# Patient Record
Sex: Female | Born: 1980
Health system: Southern US, Community
[De-identification: ages and names within clinical notes are randomized; demographics above are authoritative.]

## PROBLEM LIST (undated history)

## (undated) DIAGNOSIS — R102 Pelvic and perineal pain: Secondary | ICD-10-CM

## (undated) DIAGNOSIS — R87629 Unspecified abnormal cytological findings in specimens from vagina: Secondary | ICD-10-CM

## (undated) DIAGNOSIS — N946 Dysmenorrhea, unspecified: Secondary | ICD-10-CM

## (undated) DIAGNOSIS — Z8619 Personal history of other infectious and parasitic diseases: Secondary | ICD-10-CM

## (undated) DIAGNOSIS — N921 Excessive and frequent menstruation with irregular cycle: Secondary | ICD-10-CM

## (undated) DIAGNOSIS — IMO0002 Reserved for concepts with insufficient information to code with codable children: Secondary | ICD-10-CM

## (undated) DIAGNOSIS — R87619 Unspecified abnormal cytological findings in specimens from cervix uteri: Secondary | ICD-10-CM

## (undated) DIAGNOSIS — Z87440 Personal history of urinary (tract) infections: Secondary | ICD-10-CM

## (undated) DIAGNOSIS — G43909 Migraine, unspecified, not intractable, without status migrainosus: Secondary | ICD-10-CM

## (undated) DIAGNOSIS — M797 Fibromyalgia: Secondary | ICD-10-CM

## (undated) DIAGNOSIS — I10 Essential (primary) hypertension: Secondary | ICD-10-CM

## (undated) DIAGNOSIS — F17200 Nicotine dependence, unspecified, uncomplicated: Secondary | ICD-10-CM

## (undated) DIAGNOSIS — R350 Frequency of micturition: Secondary | ICD-10-CM

## (undated) DIAGNOSIS — M069 Rheumatoid arthritis, unspecified: Secondary | ICD-10-CM

## (undated) HISTORY — DX: Pelvic and perineal pain: R10.2

## (undated) HISTORY — DX: Excessive and frequent menstruation with irregular cycle: N92.1

## (undated) HISTORY — DX: Dysmenorrhea, unspecified: N94.6

## (undated) HISTORY — PX: TUBAL LIGATION: SHX77

## (undated) HISTORY — DX: Migraine, unspecified, not intractable, without status migrainosus: G43.909

## (undated) HISTORY — DX: Unspecified abnormal cytological findings in specimens from vagina: R87.629

## (undated) HISTORY — DX: Personal history of urinary (tract) infections: Z87.440

## (undated) HISTORY — DX: Personal history of other infectious and parasitic diseases: Z86.19

## (undated) HISTORY — DX: Fibromyalgia: M79.7

## (undated) HISTORY — DX: Reserved for concepts with insufficient information to code with codable children: IMO0002

## (undated) HISTORY — DX: Rheumatoid arthritis, unspecified: M06.9

## (undated) HISTORY — DX: Essential (primary) hypertension: I10

## (undated) HISTORY — DX: Frequency of micturition: R35.0

## (undated) HISTORY — DX: Unspecified abnormal cytological findings in specimens from cervix uteri: R87.619

## (undated) HISTORY — DX: Nicotine dependence, unspecified, uncomplicated: F17.200

---

## 2000-05-26 ENCOUNTER — Emergency Department (HOSPITAL_COMMUNITY): Admission: EM | Admit: 2000-05-26 | Discharge: 2000-05-26 | Payer: Self-pay | Admitting: *Deleted

## 2000-08-17 ENCOUNTER — Emergency Department (HOSPITAL_COMMUNITY): Admission: EM | Admit: 2000-08-17 | Discharge: 2000-08-17 | Payer: Self-pay | Admitting: Emergency Medicine

## 2000-08-17 ENCOUNTER — Encounter: Payer: Self-pay | Admitting: *Deleted

## 2000-09-09 ENCOUNTER — Other Ambulatory Visit: Admission: RE | Admit: 2000-09-09 | Discharge: 2000-09-09 | Payer: Self-pay | Admitting: Obstetrics and Gynecology

## 2001-10-28 ENCOUNTER — Other Ambulatory Visit: Admission: RE | Admit: 2001-10-28 | Discharge: 2001-10-28 | Payer: Self-pay | Admitting: Obstetrics and Gynecology

## 2001-11-05 ENCOUNTER — Emergency Department (HOSPITAL_COMMUNITY): Admission: EM | Admit: 2001-11-05 | Discharge: 2001-11-05 | Payer: Self-pay | Admitting: *Deleted

## 2002-06-17 ENCOUNTER — Emergency Department (HOSPITAL_COMMUNITY): Admission: EM | Admit: 2002-06-17 | Discharge: 2002-06-17 | Payer: Self-pay | Admitting: Emergency Medicine

## 2002-09-25 ENCOUNTER — Encounter: Payer: Self-pay | Admitting: Internal Medicine

## 2002-09-25 ENCOUNTER — Ambulatory Visit (HOSPITAL_COMMUNITY): Admission: RE | Admit: 2002-09-25 | Discharge: 2002-09-25 | Payer: Self-pay | Admitting: Internal Medicine

## 2002-11-27 ENCOUNTER — Emergency Department (HOSPITAL_COMMUNITY): Admission: EM | Admit: 2002-11-27 | Discharge: 2002-11-27 | Payer: Self-pay | Admitting: Emergency Medicine

## 2002-11-29 ENCOUNTER — Emergency Department (HOSPITAL_COMMUNITY): Admission: EM | Admit: 2002-11-29 | Discharge: 2002-11-29 | Payer: Self-pay | Admitting: Emergency Medicine

## 2002-12-10 ENCOUNTER — Emergency Department (HOSPITAL_COMMUNITY): Admission: EM | Admit: 2002-12-10 | Discharge: 2002-12-10 | Payer: Self-pay | Admitting: Emergency Medicine

## 2003-01-31 ENCOUNTER — Emergency Department (HOSPITAL_COMMUNITY): Admission: EM | Admit: 2003-01-31 | Discharge: 2003-01-31 | Payer: Self-pay | Admitting: Emergency Medicine

## 2003-07-20 ENCOUNTER — Emergency Department (HOSPITAL_COMMUNITY): Admission: EM | Admit: 2003-07-20 | Discharge: 2003-07-20 | Payer: Self-pay | Admitting: Emergency Medicine

## 2004-01-23 HISTORY — PX: TUBAL LIGATION: SHX77

## 2004-02-08 ENCOUNTER — Observation Stay (HOSPITAL_COMMUNITY): Admission: AD | Admit: 2004-02-08 | Discharge: 2004-02-09 | Payer: Self-pay | Admitting: Obstetrics and Gynecology

## 2004-02-09 ENCOUNTER — Ambulatory Visit (HOSPITAL_COMMUNITY): Admission: AD | Admit: 2004-02-09 | Discharge: 2004-02-09 | Payer: Self-pay | Admitting: Obstetrics and Gynecology

## 2004-02-22 ENCOUNTER — Ambulatory Visit (HOSPITAL_COMMUNITY): Admission: AD | Admit: 2004-02-22 | Discharge: 2004-02-22 | Payer: Self-pay | Admitting: Obstetrics and Gynecology

## 2004-03-03 ENCOUNTER — Ambulatory Visit (HOSPITAL_COMMUNITY): Admission: AD | Admit: 2004-03-03 | Discharge: 2004-03-03 | Payer: Self-pay | Admitting: Obstetrics and Gynecology

## 2004-03-06 ENCOUNTER — Ambulatory Visit (HOSPITAL_COMMUNITY): Admission: AD | Admit: 2004-03-06 | Discharge: 2004-03-06 | Payer: Self-pay | Admitting: Obstetrics and Gynecology

## 2004-03-20 ENCOUNTER — Inpatient Hospital Stay (HOSPITAL_COMMUNITY): Admission: RE | Admit: 2004-03-20 | Discharge: 2004-03-22 | Payer: Self-pay | Admitting: Obstetrics and Gynecology

## 2004-08-28 ENCOUNTER — Emergency Department (HOSPITAL_COMMUNITY): Admission: EM | Admit: 2004-08-28 | Discharge: 2004-08-28 | Payer: Self-pay | Admitting: Emergency Medicine

## 2004-08-31 ENCOUNTER — Emergency Department (HOSPITAL_COMMUNITY): Admission: EM | Admit: 2004-08-31 | Discharge: 2004-08-31 | Payer: Self-pay | Admitting: Emergency Medicine

## 2005-03-13 ENCOUNTER — Emergency Department (HOSPITAL_COMMUNITY): Admission: EM | Admit: 2005-03-13 | Discharge: 2005-03-13 | Payer: Self-pay | Admitting: Emergency Medicine

## 2005-03-19 ENCOUNTER — Emergency Department (HOSPITAL_COMMUNITY): Admission: EM | Admit: 2005-03-19 | Discharge: 2005-03-19 | Payer: Self-pay | Admitting: Emergency Medicine

## 2006-01-31 ENCOUNTER — Ambulatory Visit: Payer: Self-pay | Admitting: Orthopedic Surgery

## 2006-02-11 ENCOUNTER — Ambulatory Visit: Payer: Self-pay | Admitting: Orthopedic Surgery

## 2006-02-13 ENCOUNTER — Encounter (HOSPITAL_COMMUNITY): Admission: RE | Admit: 2006-02-13 | Discharge: 2006-03-15 | Payer: Self-pay | Admitting: Orthopedic Surgery

## 2006-02-21 ENCOUNTER — Ambulatory Visit: Payer: Self-pay | Admitting: Orthopedic Surgery

## 2006-03-14 ENCOUNTER — Ambulatory Visit: Payer: Self-pay | Admitting: Orthopedic Surgery

## 2006-03-19 ENCOUNTER — Ambulatory Visit (HOSPITAL_COMMUNITY): Admission: RE | Admit: 2006-03-19 | Discharge: 2006-03-19 | Payer: Self-pay | Admitting: Obstetrics & Gynecology

## 2006-03-20 ENCOUNTER — Encounter (HOSPITAL_COMMUNITY): Admission: RE | Admit: 2006-03-20 | Discharge: 2006-04-19 | Payer: Self-pay | Admitting: Orthopedic Surgery

## 2006-04-11 ENCOUNTER — Ambulatory Visit: Payer: Self-pay | Admitting: Orthopedic Surgery

## 2006-06-27 ENCOUNTER — Ambulatory Visit: Payer: Self-pay | Admitting: Orthopedic Surgery

## 2006-08-01 ENCOUNTER — Ambulatory Visit: Payer: Self-pay | Admitting: Orthopedic Surgery

## 2006-08-27 ENCOUNTER — Ambulatory Visit (HOSPITAL_COMMUNITY): Admission: RE | Admit: 2006-08-27 | Discharge: 2006-08-27 | Payer: Self-pay | Admitting: Orthopedic Surgery

## 2006-09-09 ENCOUNTER — Ambulatory Visit: Payer: Self-pay | Admitting: Orthopedic Surgery

## 2006-10-11 ENCOUNTER — Emergency Department (HOSPITAL_COMMUNITY): Admission: EM | Admit: 2006-10-11 | Discharge: 2006-10-11 | Payer: Self-pay | Admitting: Emergency Medicine

## 2006-10-21 ENCOUNTER — Ambulatory Visit: Payer: Self-pay | Admitting: Orthopedic Surgery

## 2006-10-21 DIAGNOSIS — M7580 Other shoulder lesions, unspecified shoulder: Secondary | ICD-10-CM

## 2006-10-21 DIAGNOSIS — M549 Dorsalgia, unspecified: Secondary | ICD-10-CM | POA: Insufficient documentation

## 2006-10-21 DIAGNOSIS — M25519 Pain in unspecified shoulder: Secondary | ICD-10-CM

## 2006-10-25 ENCOUNTER — Ambulatory Visit (HOSPITAL_COMMUNITY): Admission: RE | Admit: 2006-10-25 | Discharge: 2006-10-25 | Payer: Self-pay | Admitting: Orthopedic Surgery

## 2006-10-25 ENCOUNTER — Encounter: Payer: Self-pay | Admitting: Orthopedic Surgery

## 2006-10-31 ENCOUNTER — Ambulatory Visit: Payer: Self-pay | Admitting: Orthopedic Surgery

## 2006-11-12 ENCOUNTER — Other Ambulatory Visit: Admission: RE | Admit: 2006-11-12 | Discharge: 2006-11-12 | Payer: Self-pay | Admitting: Obstetrics and Gynecology

## 2006-12-02 ENCOUNTER — Emergency Department (HOSPITAL_COMMUNITY): Admission: EM | Admit: 2006-12-02 | Discharge: 2006-12-02 | Payer: Self-pay | Admitting: Emergency Medicine

## 2006-12-05 ENCOUNTER — Ambulatory Visit: Payer: Self-pay | Admitting: Orthopedic Surgery

## 2006-12-05 DIAGNOSIS — IMO0002 Reserved for concepts with insufficient information to code with codable children: Secondary | ICD-10-CM | POA: Insufficient documentation

## 2007-01-03 ENCOUNTER — Encounter: Payer: Self-pay | Admitting: Orthopedic Surgery

## 2007-05-23 ENCOUNTER — Other Ambulatory Visit: Admission: RE | Admit: 2007-05-23 | Discharge: 2007-05-23 | Payer: Self-pay | Admitting: Obstetrics and Gynecology

## 2007-06-06 ENCOUNTER — Ambulatory Visit: Payer: Self-pay | Admitting: Cardiology

## 2007-06-10 ENCOUNTER — Ambulatory Visit (HOSPITAL_COMMUNITY): Admission: RE | Admit: 2007-06-10 | Discharge: 2007-06-10 | Payer: Self-pay | Admitting: Obstetrics & Gynecology

## 2007-08-28 ENCOUNTER — Encounter: Payer: Self-pay | Admitting: Orthopedic Surgery

## 2008-09-30 IMAGING — US US SOFT TISSUE HEAD/NECK
1 series · 14 of 24 positions shown · non-contrast
Comparison: None

CLINICAL DATA: Enlarged thyroid gland

THYROID ULTRASOUND
TECHNIQUE: Ultrasound examination of the thyroid gland and adjacent
soft tissues was performed.

[Series 1: unknown · 0.09mm/px · 14 of 24 slices shown]
[im 1/24]
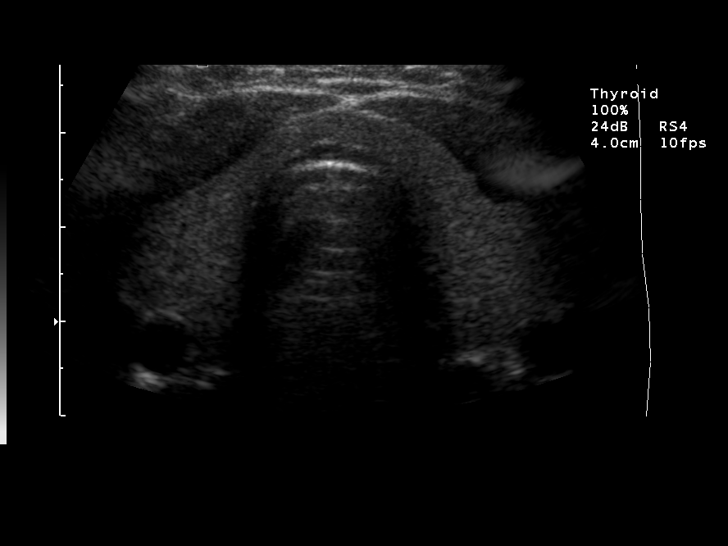
[im 3/24]
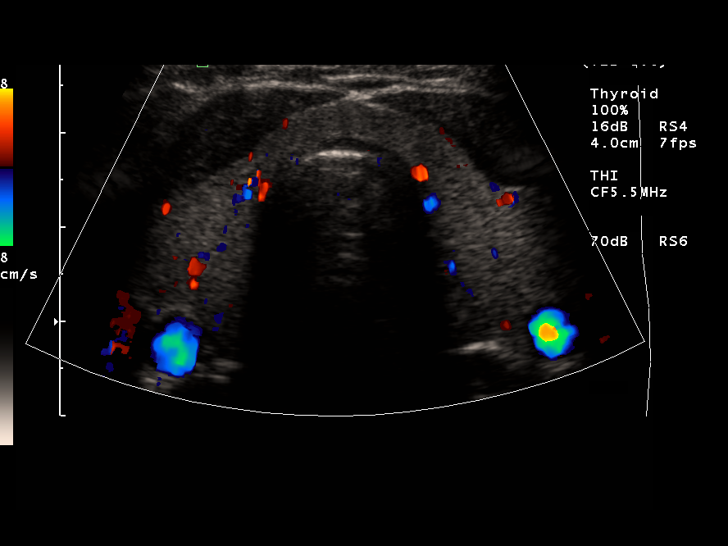
[im 5/24]
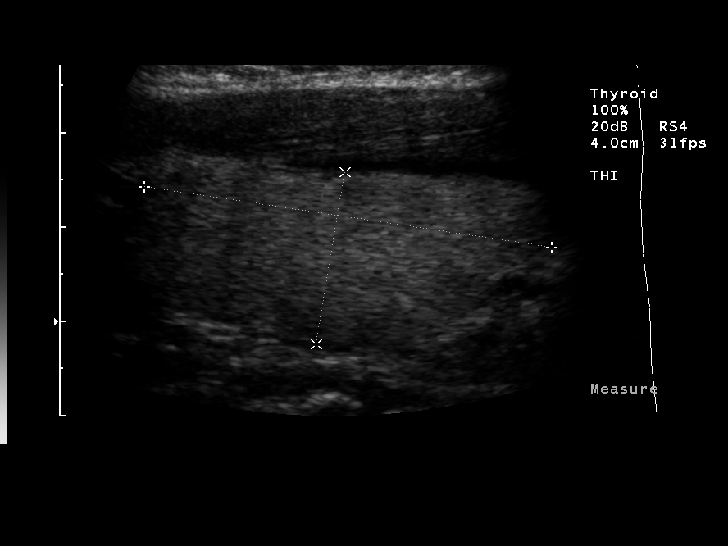
[im 7/24]
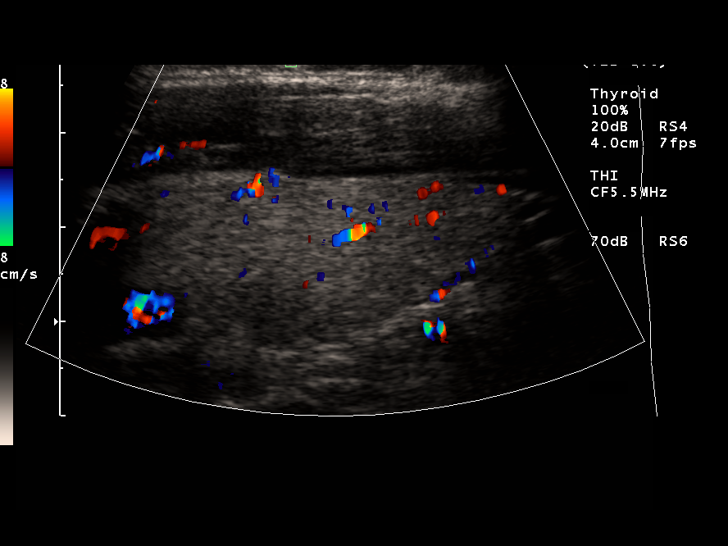
[im 8/24]
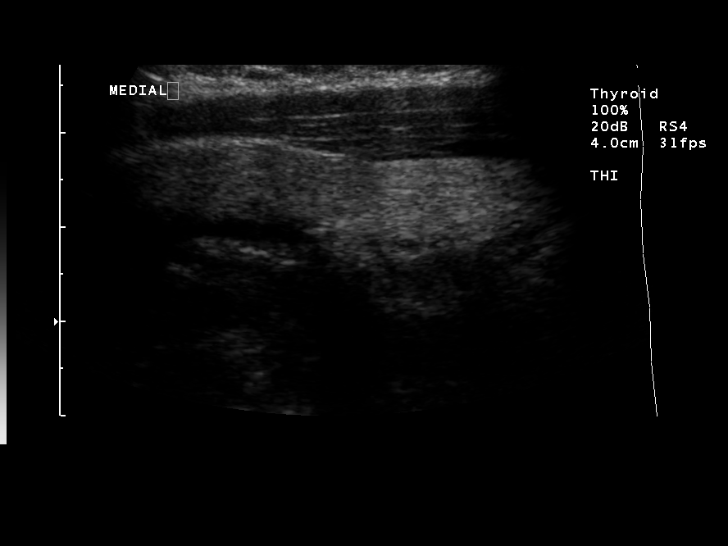
[im 10/24]
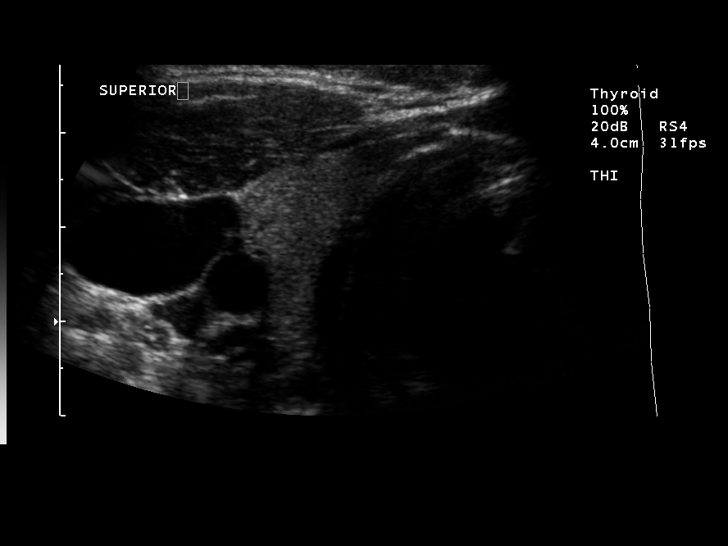
[im 12/24]
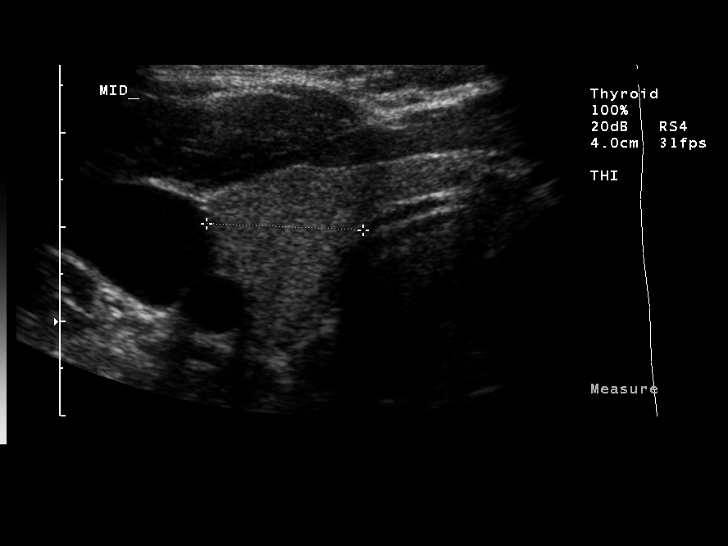
[im 13/24]
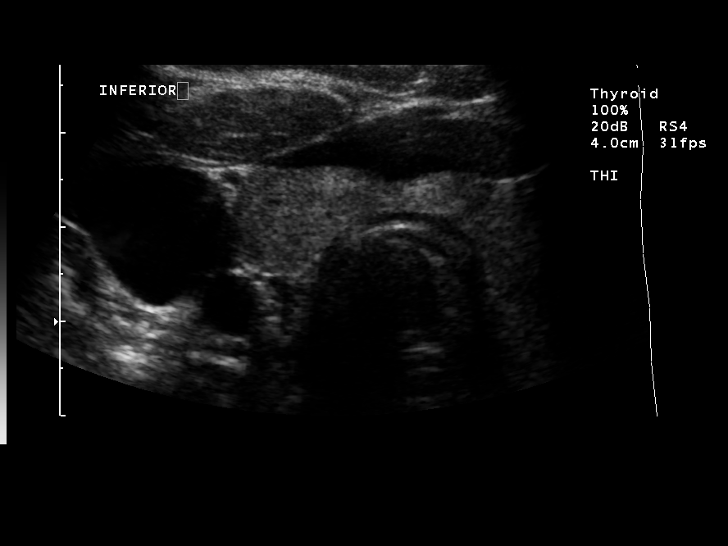
[im 15/24]
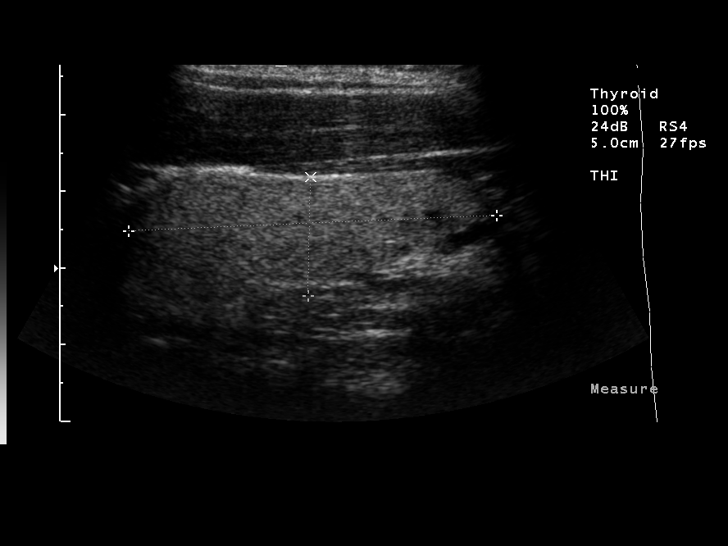
[im 17/24]
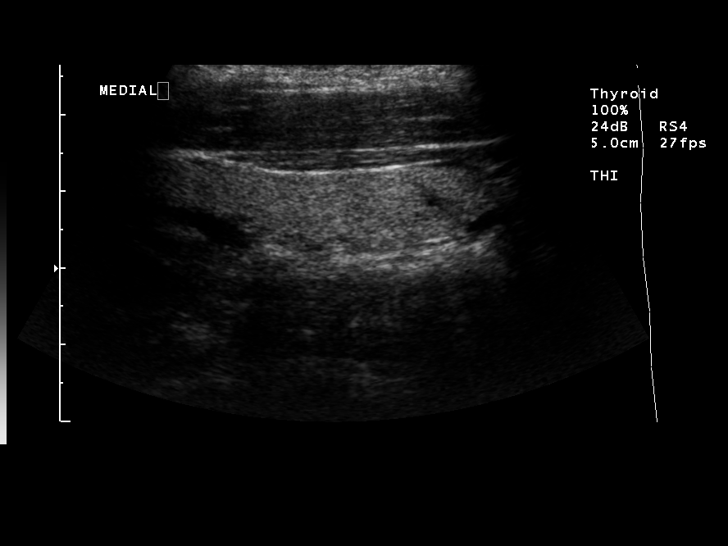
[im 19/24]
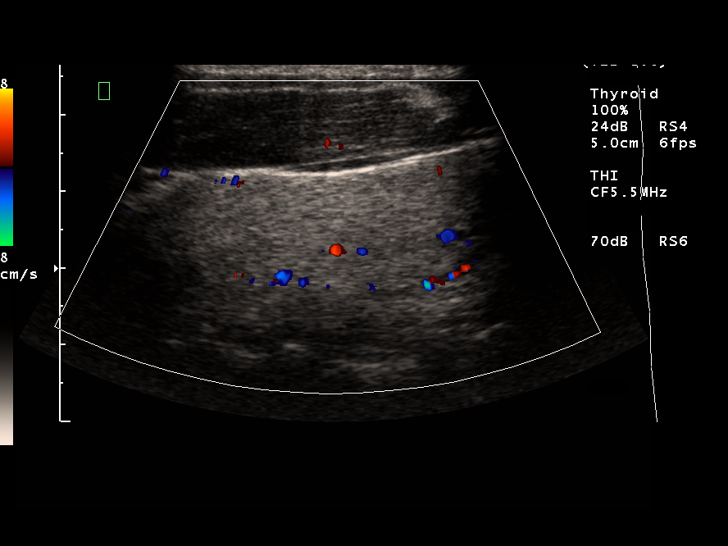
[im 20/24]
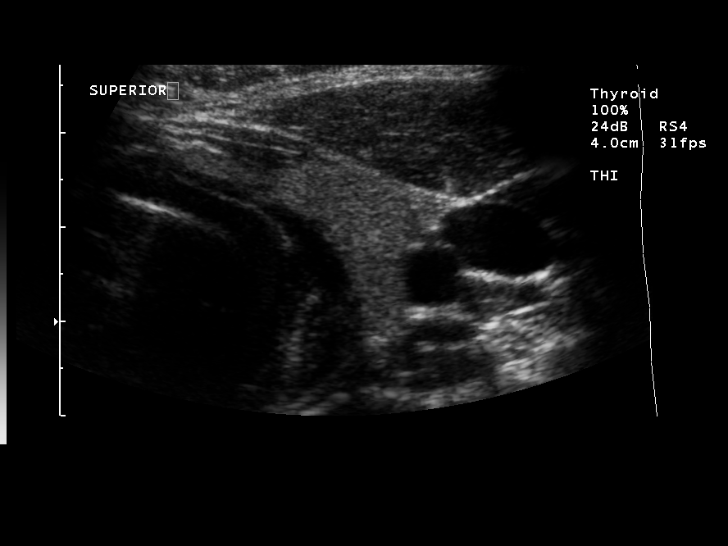
[im 22/24]
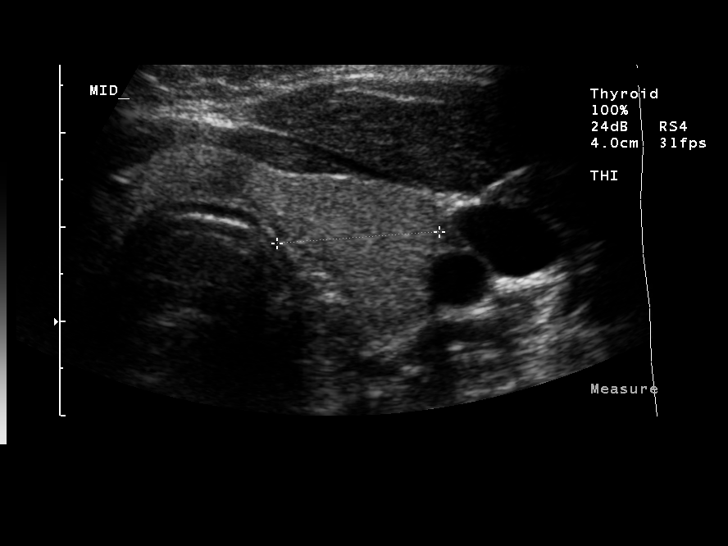
[im 24/24]
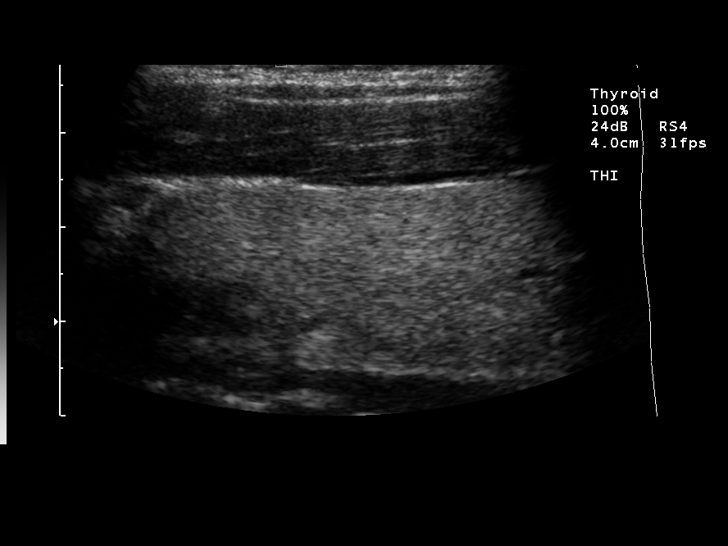

[14 of 24 positions shown; findings below may reference images not displayed]

FINDINGS: Right thyroid lobe 4.4 cm length by 1.8 cm AP by 1.7 cm transverse.
Left thyroid lobe 4.8 cm length by 1.6 cm AP by 1.7 cm transverse.
Thyroid isthmus 4 mm thick.
Normal appearing homogeneous thyroid echogenicity throughout both
lobes.
No thyroid mass, calcification, or cyst.
No regional adenopathy.
Symmetric internal thyroidal blood flow on color Doppler imaging.
IMPRESSION: Normal thyroid ultrasound.

## 2008-10-12 ENCOUNTER — Other Ambulatory Visit: Admission: RE | Admit: 2008-10-12 | Discharge: 2008-10-12 | Payer: Self-pay | Admitting: Obstetrics and Gynecology

## 2008-10-20 ENCOUNTER — Telehealth: Payer: Self-pay | Admitting: Orthopedic Surgery

## 2008-10-22 ENCOUNTER — Encounter (INDEPENDENT_AMBULATORY_CARE_PROVIDER_SITE_OTHER): Payer: Self-pay | Admitting: *Deleted

## 2008-11-01 ENCOUNTER — Emergency Department (HOSPITAL_COMMUNITY): Admission: EM | Admit: 2008-11-01 | Discharge: 2008-11-01 | Payer: Self-pay | Admitting: Emergency Medicine

## 2008-11-17 ENCOUNTER — Telehealth: Payer: Self-pay | Admitting: Orthopedic Surgery

## 2008-12-10 ENCOUNTER — Encounter: Admission: RE | Admit: 2008-12-10 | Discharge: 2008-12-10 | Payer: Self-pay | Admitting: Orthopedic Surgery

## 2009-02-09 ENCOUNTER — Encounter: Admission: RE | Admit: 2009-02-09 | Discharge: 2009-02-09 | Payer: Self-pay | Admitting: Orthopedic Surgery

## 2009-02-15 ENCOUNTER — Ambulatory Visit: Payer: Self-pay | Admitting: Orthopedic Surgery

## 2009-02-15 DIAGNOSIS — M5126 Other intervertebral disc displacement, lumbar region: Secondary | ICD-10-CM

## 2009-02-24 ENCOUNTER — Encounter: Payer: Self-pay | Admitting: Orthopedic Surgery

## 2009-02-24 ENCOUNTER — Telehealth: Payer: Self-pay | Admitting: Orthopedic Surgery

## 2009-02-24 ENCOUNTER — Telehealth (INDEPENDENT_AMBULATORY_CARE_PROVIDER_SITE_OTHER): Payer: Self-pay | Admitting: *Deleted

## 2009-03-28 ENCOUNTER — Ambulatory Visit (HOSPITAL_COMMUNITY): Admission: RE | Admit: 2009-03-28 | Discharge: 2009-03-28 | Payer: Self-pay | Admitting: Obstetrics and Gynecology

## 2009-04-19 ENCOUNTER — Telehealth: Payer: Self-pay | Admitting: Orthopedic Surgery

## 2009-10-28 ENCOUNTER — Emergency Department (HOSPITAL_COMMUNITY): Admission: EM | Admit: 2009-10-28 | Discharge: 2009-10-28 | Payer: Self-pay | Admitting: Emergency Medicine

## 2010-02-12 ENCOUNTER — Encounter: Payer: Self-pay | Admitting: Orthopedic Surgery

## 2010-02-12 ENCOUNTER — Encounter: Payer: Self-pay | Admitting: Obstetrics & Gynecology

## 2010-02-13 ENCOUNTER — Other Ambulatory Visit
Admission: RE | Admit: 2010-02-13 | Discharge: 2010-02-13 | Payer: Self-pay | Source: Home / Self Care | Admitting: Obstetrics and Gynecology

## 2010-02-13 ENCOUNTER — Other Ambulatory Visit: Payer: Self-pay | Admitting: Adult Health
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
## 2010-02-23 NOTE — Progress Notes (Signed)
Summary: Neurosurgeon referral  Phone Note Outgoing Call   Call placed by: Waldon Reining,  February 24, 2009 12:24 PM Call placed to: Specialist Action Taken: Information Sent Summary of Call: I faxed a referral for this patient to [facility] to be seen.

## 2010-02-23 NOTE — Progress Notes (Signed)
Summary: [name] appt 04/19/09 2:50pm  Phone Note Call from Patient Call back at Home Phone [phone]   Summary of Call: advised patient to take films not a disc for her to see dr Lovell Sheehan at [facility] on 04/19/09 at 2:50pm. Initial call taken by: Ether Griffins,  February 24, 2009 4:09 PM

## 2010-02-23 NOTE — Letter (Signed)
Summary: [facility] appointment confirmation  [facility] appointment confirmation   Imported By: Jacklynn Ganong 03/01/2009 14:48:03  _____________________________________________________________________  External Attachment:    Type:   Image     Comment:   External Document

## 2010-02-23 NOTE — Progress Notes (Signed)
Summary: No show for neurosurgeon .  Phone Note From Other Clinic   Caller: Referral Coordinator Summary of Call: Patient did not show for her appointment to Dr. Lovell Sheehan Initial call taken by: Waldon Reining,  April 19, 2009 5:09 PM

## 2010-04-05 LAB — COMPREHENSIVE METABOLIC PANEL
ALT: 77 U/L — ABNORMAL HIGH (ref 0–35)
Albumin: 4.4 g/dL (ref 3.5–5.2)
Alkaline Phosphatase: 75 U/L (ref 39–117)
Chloride: 109 mEq/L (ref 96–112)
GFR calc Af Amer: >60 mL/min (ref >60)
Potassium: 3.4 mEq/L — ABNORMAL LOW (ref 3.5–5.1)
Sodium: 142 mEq/L (ref 135–145)

## 2010-04-05 LAB — URINE MICROSCOPIC-ADD ON

## 2010-04-05 LAB — URINALYSIS, ROUTINE W REFLEX MICROSCOPIC
Bilirubin Urine: NEGATIVE
Leukocytes, UA: NEGATIVE
Nitrite: NEGATIVE
Protein, ur: NEGATIVE mg/dL

## 2010-04-05 LAB — PREGNANCY, URINE: Preg Test, Ur: NEGATIVE

## 2010-04-05 LAB — CBC
HCT: 39 % (ref 36.0–46.0)
Hemoglobin: 13 g/dL (ref 12.0–15.0)
MCH: 32.2 pg (ref 26.0–34.0)
MCHC: 33.3 g/dL (ref 30.0–36.0)
Platelets: 208 10*3/uL (ref 150–400)
RBC: 4.05 MIL/uL (ref 3.87–5.11)

## 2010-04-05 LAB — DIFFERENTIAL
Lymphocytes Relative: 14 % (ref 12–46)
Lymphs Abs: 1.4 10*3/uL (ref 0.7–4.0)
Monocytes Absolute: 0.3 10*3/uL (ref 0.1–1.0)
Monocytes Relative: 3 % (ref 3–12)
Neutro Abs: 8.3 10*3/uL — ABNORMAL HIGH (ref 1.7–7.7)
Neutrophils Relative %: 83 % — ABNORMAL HIGH (ref 43–77)

## 2010-04-05 LAB — LIPASE, BLOOD: Lipase: 30 U/L (ref 11–59)

## 2010-04-14 LAB — TSH: TSH: 0.631 u[IU]/mL (ref 0.350–4.500)

## 2010-04-14 LAB — CBC
HCT: 35.4 % — ABNORMAL LOW (ref 36.0–46.0)
MCHC: 34.4 g/dL (ref 30.0–36.0)
MCV: 100.9 fL — ABNORMAL HIGH (ref 78.0–100.0)
Platelets: 228 10*3/uL (ref 150–400)
RDW: 13.6 % (ref 11.5–15.5)

## 2010-04-14 LAB — HCG, QUANTITATIVE, PREGNANCY: hCG, Beta Chain, Quant, S: <2 m[IU]/mL (ref <5)

## 2010-06-06 NOTE — Assessment & Plan Note (Signed)
[facility]                          [facility] OFFICE NOTE   [name]                     MRN:          [id number]  DATE:06/06/2007                            DOB:          21-Aug-1980    REFERRING PHYSICIAN:  Samuel Jester   REASON FOR CONSULTATION:  Chest pain.   HISTORY OF PRESENT ILLNESS:  [name] is a pleasant 30 year old  woman with a reported history of rheumatic fever at age 27 and possibly  an enlarged heart based on very limited information.  She has had a  myriad of symptoms over the last several months to years including  arthralgias, headaches, occasional rashes and dark skin changes, reflux  symptoms, possibly fibromyalgia and intermittent episodes of atypical  chest pain.  She states that she has been seen by a rheumatologist with  prior extensive evaluation and as yet has no formal unifying diagnosis.  We focused on her chest pains today.  She describes very sporadic sharp  or shooting chest pains that may last up to several minutes at a time  and are completely unpredictable.  She has had approximately four  episodes this entire year since January.  She states that she does  exercise 2 or 3 days a week, sometimes walking at the track or working  out at [employer].  She has no limiting chest pain or shortness of breath  associated with this and no specific recurrence with activity.  Her  electrocardiogram today shows sinus rhythm with sinus arrhythmia and  otherwise normal intervals.  She has had no previous cardiac evaluation  beyond prior electrocardiograms.  She does report a history of premature  cardiovascular disease involving her paternal grandfather.  She is not  certain whether any of her other family members have heart disease.   ALLERGIES:  No known drug allergies.   PRESENT MEDICATIONS:  1. Metronidazole 500 mg p.o. b.i.d. for a limited course.  2. She also uses Topamax p.r.n.   PAST MEDICAL  HISTORY:  As outlined above.  She denies any major  hospitalizations.  No clear history of hypertension, diabetes mellitus,  hyperlipidemia or dysrhythmia.   REVIEW OF SYSTEMS:  As outlined above.  No fevers or chills.  No cough  or hemoptysis.  No changes in bowel or bladder pattern.  No significant  lower extremity edema, orthopnea, PND.  No syncope.   PHYSICAL EXAMINATION:  VITAL SIGNS:  Blood pressure is 102/64, heart  rate is 71, weight is 201 pounds.  GENERAL:  This is an overweight woman in no acute distress.  HEENT:  Conjunctivae, lids normal.  Pharynx is clear.  NECK:  Neck is supple.  No elevated jugular venous pressure.  No loud  bruits or thyromegaly is noted.  LUNGS:  Clear without labored breathing at rest.  CARDIAC:  Exam reveals a regular rate and rhythm.  No pathologic  murmurs.  No pericardial rub or S3 gallop.  ABDOMEN:  Soft, nontender.  EXTREMITIES:  Exhibit no frank pitting edema.  Distal pulses are 2+.  SKIN:  Warm and dry.  MUSCULOSKELETAL:  No kyphosis noted.  NEUROPSYCHIATRIC:  The patient is alert and oriented x3.  Affect seems  appropriate.   IMPRESSION AND RECOMMENDATIONS:  Atypical sporadic chest pain.  The  patient reports a history of rheumatic fever as a child and also  possibly an enlarged heart.  She has no pathologic murmurs on  examination.  Electrocardiogram shows sinus arrhythmia.  Otherwise  normal intervals.  She is very concerned about her cardiac status and  for that matter her other symptom complex as well.  I explained to her  that the likelihood of her having major underlying cardiovascular  disease is fairly low.  As a matter of reassurance we will arrange an  exercise echocardiogram.  This will exclude ischemia which I highly  doubt and also ensure that she has no significant valvular problems or  cardiomegaly.  If this is reassuring I would not anticipate any further  cardiac evaluation at this time.     Jonelle Sidle, MD   Electronically Signed    SGM/MedQ  DD: 06/06/2007  DT: 06/06/2007  Job #: [id number]   cc:   Samuel Jester

## 2010-06-09 NOTE — H&P (Signed)
NAMEDACHELLE, MOLZAHN NO.:  [id number]   MEDICAL RECORD NO.:  [id number]          PATIENT TYPE:  OIB   LOCATION:  LDR4                          FACILITY:  [facility]   PHYSICIAN:  Tilda Burrow, M.D. DATE OF BIRTH:  February 10, 1980   DATE OF ADMISSION:  02/09/2004  DATE OF DISCHARGE:  LH                                HISTORY & PHYSICAL   REASON FOR ADMISSION:  [name] presents to the office complaining of uterine  cramping.  She was sent to the office over here, and was noted to be having  irregular uterine contractions.  The cervix was 1 cm, thick, soft.  Posterior presenting part is high.  She is having irregular contraction, but  due to patient discomfort, I am going to discharge the patient home on p.o.  [place].  She is to follow up in the office on Friday, and at that time, we  will do a fetal fibronectin test.     Darl   DL/MEDQ  D:  40/98/1191  T:  02/09/2004  Job:  [id number]   cc:   [facility]

## 2010-06-09 NOTE — Discharge Summary (Signed)
[name], [name] NO.:  [id number]   MEDICAL RECORD NO.:  [id number]          PATIENT TYPE:  INP   LOCATION:  A418                          FACILITY:  [facility]   PHYSICIAN:  Tilda Burrow, M.D. DATE OF BIRTH:  29-Jul-1980   DATE OF ADMISSION:  03/20/2004  DATE OF DISCHARGE:  03/01/2006LH                                 DISCHARGE SUMMARY   ADMISSION DIAGNOSES:  Pregnancy at 38-1/2 weeks, repeat Cesarean section,  not for trial of labor.   DISCHARGE DIAGNOSES:  1.  Pregnancy at 38-1/2 weeks, delivered repeat Cesarean section, not for      trial of labor.  2.  Infant with mild hypospadias.   PROCEDURE:  March 20, 2004, repeat low transverse cervical Cesarean  section.  March 22, 2004 Gomco circumcision on female.   HOSPITAL COURSE:  This 30 year old female gravida 2, para 1, was admitted  for repeat Cesarean section which went excellently.  See admitting history  for details. Postoperatively her course was uneventful.  The patient  discharged in two days.   DISCHARGE MEDICATIONS:  1.  Tylox.  2.  Chromagen Forte for anemia.   LABORATORY DATA:  Patient had hemoglobin 9.6, hematocrit 27.6 postoperative  compared to 10.7 an 30.5 preoperatively.  Blood type is B positive.  No  transfusions were necessary.   DISPOSITION:  Patient was discharged home in good condition for follow up  staple removal five days.   ADDENDUM:  The patient's baby's father is requesting paternity testing.  [name] is tearfully upset but has information regarding genetic testing  options.      JVF/MEDQ  D:  03/22/2004  T:  03/22/2004  Job:  [id number]

## 2010-06-09 NOTE — Op Note (Signed)
[name], BAYLISS NO.:  [id number]   MEDICAL RECORD NO.:  [id number]          PATIENT TYPE:  INP   LOCATION:  A418                          FACILITY:  [facility]   PHYSICIAN:  Tilda Burrow, M.D. DATE OF BIRTH:  03/19/1980   DATE OF PROCEDURE:  03/20/2004  DATE OF DISCHARGE:                                 OPERATIVE REPORT   PREOPERATIVE DIAGNOSES:  1.  Pregnancy at 38-1/2 weeks' gestation.  2.  Repeat cesarean section, not for trial of labor.   POSTOPERATIVE DIAGNOSES:  1.  Pregnancy at 38-1/2 weeks' gestation.  2.  Repeat cesarean section, not for trial of labor.   PROCEDURE:  Repeat low transverse cesarean cervical section.   SURGEON:  Tilda Burrow, M.D.   ASSISTANTAsencion Noble, C.S.T.-F.A.   ANESTHESIA:  Spinal, Nelda Severe, C.R.N.A.   COMPLICATIONS:  None.   FINDINGS:  Healthy female infant, Apgars 9 and 9, weight _noted in peds chart-  jvf__.   INDICATIONS:  A 30 year old multipara, having prior cesarean section.   DETAILS OF PROCEDURE:  The patient was taken to the operating room and  prepped and draped for lower abdominal surgery with Pfannenstiel-type  incision repeated, with excision of the old scar and an approximately 2 cm  wide strip of surrounding skin.  The fascia was elevated off the rectus  muscles with minimal difficulty and the peritoneal cavity entered in the  midline in the standard method of Pfannenstiel.  The bladder flap was  developed on the lower uterine segment without difficulty and transverse  uterine incision made in the lower uterine segment, extended laterally using  index finger traction.  The infant could not be easily delivered using  fundal pressure, and so vacuum extraction was used to guide the vertex to  ease the baby out with fundal pressure.  The cord was clamped and the infant  placed in the care of Dr. Milinda Cave for management.  See his notes for  details.   The cord blood samples were obtained, the placenta  delivered, Crede massage,  and a single-layer running locking closure of the uterus performed using 0  chromic.  The bladder flap was approximated using 2-0 chromic.  There was a  venous oozer on the bladder flap that required significant placement of  three stitches to control the blood oozing, with subsequent hemostasis  satisfactorily achieved.  The abdomen was irrigated with antibiotic solution  as the uterus had been.  The uterus was then considered hemostatic, the  anterior peritoneum closed with 2-0 chromic, the fascia  closed with continuous running 0 Vicryl, the subcu tissues reapproximated  with 2-0 Prolene, staple closure of the skin completed the procedure.  The  patient tolerated the procedure well and went to the recovery room in good  condition.      JVF/MEDQ  D:  03/20/2004  T:  03/20/2004  Job:  [id number]   cc:   Jeoffrey Massed, MD  [address]  [place]  [place] [address]  Fax: [phone]

## 2010-06-09 NOTE — H&P (Signed)
[name], [name] NO.:  [id number]   MEDICAL RECORD NO.:  [id number]          PATIENT TYPE:  AMB   LOCATION:                                 FACILITY:   PHYSICIAN:  Tilda Burrow, M.D. DATE OF BIRTH:  1981/01/13   DATE OF ADMISSION:  03/20/2004  DATE OF DISCHARGE:  LH                                HISTORY & PHYSICAL   ADMISSION DIAGNOSIS:  Pregnancy, 38-1/[redacted] weeks gestation, for repeat cesarean  section (not for trial of labor).   HISTORY OF PRESENT ILLNESS:  This is a 30 year old female, gravida 2, para  1, AB 0, who has been followed throughout her pregnancy for pregnancy care.  She is reaching 38-1/[redacted] weeks gestation and is scheduled for repeat cesarean  section at this time.  She was initially seen for pregnancy care at the  [facility], transferring to our office in August at 8 weeks.  She  has been seen in our office for an impressive 22 office visits for minor  concerns with pregnancy, specifically notable for nonspecific antibodies at  prenatal 1 visit.  She has had an uneventful fetal growth pattern during the  pregnancy and is admitted for repeat cesarean section on March 20, 2004.   PAST MEDICAL HISTORY:  Positive for rheumatoid arthritis.  She has a history  of abnormal Pap.   PAST SURGICAL HISTORY:  Prior cesarean section for cephalopelvic  disproportion after being induced for hypertension.  This infant weighed 7  pounds 8 ounces.   ALLERGIES:  None known.   HABITS:  Cigarettes:  Four per day.  Alcohol and recreational drugs denied.   PHYSICAL EXAMINATION:  HEIGHT:  5 feet 3 inches.  WEIGHT:  184 pounds.  VITAL SIGNS:  Blood pressure 102/62.  HEENT:  Pupils equal, round and reactive.  Extraocular movements intact.  NECK:  Supple.  Trachea midline.  ABDOMEN:  35 cm.  Estimated fetal weight 6-1/2 pounds.  PELVIC:  Cervix 1-2, 50%, soft, mid position.   PRENATAL LABORATORIES:  Blood type B positive.  Urine drug screen  initially  positive for marijuana and not rechecked.  Nonspecific antibodies on initial  antibody screen.  Hepatitis, HIV, GC and chlamydia are all negative.  MSAFP 1:6500.  She originally planned to get her tubes tied, but has changed  her mind.  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   PLAN:  Repeat cesarean section on March 21, 2003.      JVF/MEDQ  D:  03/09/2004  T:  03/09/2004  Job:  [id number]   cc:   Diane Massed, MD  [address]  [place]  [place] [address]  Fax: [phone]

## 2010-07-03 ENCOUNTER — Emergency Department (HOSPITAL_COMMUNITY)
Admission: EM | Admit: 2010-07-03 | Discharge: 2010-07-03 | Disposition: A | Discharge status: Home / Self Care | Payer: Medicaid Other | Attending: Emergency Medicine | Admitting: Emergency Medicine

## 2010-07-03 DIAGNOSIS — B3731 Acute candidiasis of vulva and vagina: Secondary | ICD-10-CM | POA: Insufficient documentation

## 2010-07-03 DIAGNOSIS — R109 Unspecified abdominal pain: Secondary | ICD-10-CM | POA: Insufficient documentation

## 2010-07-03 DIAGNOSIS — B373 Candidiasis of vulva and vagina: Secondary | ICD-10-CM | POA: Insufficient documentation

## 2010-07-03 LAB — BASIC METABOLIC PANEL
CO2: 23 mEq/L (ref 19–32)
Chloride: 102 mEq/L (ref 96–112)
Sodium: 135 mEq/L (ref 135–145)

## 2010-07-03 LAB — CBC
Platelets: 222 10*3/uL (ref 150–400)
RBC: 4.14 MIL/uL (ref 3.87–5.11)
WBC: 7.6 10*3/uL (ref 4.0–10.5)

## 2010-07-03 LAB — WET PREP, GENITAL
Trich, Wet Prep: NONE SEEN
Yeast Wet Prep HPF POC: NONE SEEN

## 2010-07-03 LAB — URINALYSIS, ROUTINE W REFLEX MICROSCOPIC
Glucose, UA: NEGATIVE mg/dL
Leukocytes, UA: NEGATIVE
Protein, ur: NEGATIVE mg/dL
Specific Gravity, Urine: >1.03 — ABNORMAL HIGH (ref 1.005–1.030)
Urobilinogen, UA: 0.2 mg/dL (ref 0.0–1.0)

## 2010-07-03 LAB — DIFFERENTIAL
Basophils Relative: 0 % (ref 0–1)
Eosinophils Absolute: 0.1 10*3/uL (ref 0.0–0.7)
Lymphs Abs: 2.4 10*3/uL (ref 0.7–4.0)
Neutrophils Relative %: 61 % (ref 43–77)

## 2010-07-04 ENCOUNTER — Other Ambulatory Visit: Payer: Self-pay | Admitting: Obstetrics and Gynecology

## 2010-07-04 DIAGNOSIS — R1032 Left lower quadrant pain: Secondary | ICD-10-CM

## 2010-07-04 LAB — GC/CHLAMYDIA PROBE AMP, GENITAL: GC Probe Amp, Genital: NEGATIVE

## 2010-07-07 ENCOUNTER — Ambulatory Visit (HOSPITAL_COMMUNITY): Payer: Medicaid Other

## 2010-07-07 ENCOUNTER — Ambulatory Visit (HOSPITAL_COMMUNITY): Admission: RE | Admit: 2010-07-07 | Payer: Medicaid Other | Source: Ambulatory Visit

## 2010-07-11 ENCOUNTER — Ambulatory Visit (HOSPITAL_COMMUNITY): Payer: Medicaid Other

## 2010-08-12 ENCOUNTER — Emergency Department (HOSPITAL_COMMUNITY)
Admission: EM | Admit: 2010-08-12 | Discharge: 2010-08-12 | Disposition: A | Discharge status: Home / Self Care | Payer: Medicaid Other | Attending: Emergency Medicine | Admitting: Emergency Medicine

## 2010-08-12 ENCOUNTER — Encounter: Payer: Self-pay | Admitting: *Deleted

## 2010-08-12 DIAGNOSIS — R109 Unspecified abdominal pain: Secondary | ICD-10-CM | POA: Insufficient documentation

## 2010-08-12 DIAGNOSIS — R112 Nausea with vomiting, unspecified: Secondary | ICD-10-CM | POA: Insufficient documentation

## 2010-08-12 DIAGNOSIS — N390 Urinary tract infection, site not specified: Secondary | ICD-10-CM | POA: Insufficient documentation

## 2010-08-12 DIAGNOSIS — R197 Diarrhea, unspecified: Secondary | ICD-10-CM | POA: Insufficient documentation

## 2010-08-12 DIAGNOSIS — R6883 Chills (without fever): Secondary | ICD-10-CM | POA: Insufficient documentation

## 2010-08-12 DIAGNOSIS — R198 Other specified symptoms and signs involving the digestive system and abdomen: Secondary | ICD-10-CM | POA: Insufficient documentation

## 2010-08-12 LAB — URINALYSIS, ROUTINE W REFLEX MICROSCOPIC
Bilirubin Urine: NEGATIVE
Hgb urine dipstick: NEGATIVE
Protein, ur: 30 mg/dL — AB
Urobilinogen, UA: 0.2 mg/dL (ref 0.0–1.0)

## 2010-08-12 LAB — BASIC METABOLIC PANEL
Calcium: 9.4 mg/dL (ref 8.4–10.5)
Creatinine, Ser: 0.61 mg/dL (ref 0.50–1.10)
GFR calc non Af Amer: >60 mL/min (ref >60)
Glucose, Bld: 131 mg/dL — ABNORMAL HIGH (ref 70–99)
Sodium: 141 mEq/L (ref 135–145)

## 2010-08-12 LAB — URINE MICROSCOPIC-ADD ON

## 2010-08-12 LAB — CBC
MCH: 32.5 pg (ref 26.0–34.0)
MCV: 94.7 fL (ref 78.0–100.0)
Platelets: 222 10*3/uL (ref 150–400)
RBC: 3.94 MIL/uL (ref 3.87–5.11)
RDW: 13.3 % (ref 11.5–15.5)

## 2010-08-12 LAB — DIFFERENTIAL
Eosinophils Absolute: 0 10*3/uL (ref 0.0–0.7)
Eosinophils Relative: 0 % (ref 0–5)
Lymphs Abs: 1.3 10*3/uL (ref 0.7–4.0)
Monocytes Absolute: 0.2 10*3/uL (ref 0.1–1.0)

## 2010-08-12 MED ORDER — DEXTROSE 5 % IV SOLN
1.0000 g | Freq: Once | INTRAVENOUS | Status: AC
  Administered 2010-08-12: 1 g via INTRAVENOUS

## 2010-08-12 MED ORDER — CEFTRIAXONE SODIUM 1 G IJ SOLR: 1.0000 g | Freq: Once | INTRAMUSCULAR | Status: DC

## 2010-08-12 MED ORDER — CEPHALEXIN 500 MG PO CAPS: ORAL_CAPSULE | ORAL | Status: DC

## 2010-08-12 MED ORDER — SODIUM CHLORIDE 0.9 % IV BOLUS (SEPSIS)
1000.0000 mL | Freq: Once | INTRAVENOUS | Status: AC
  Administered 2010-08-12: 1000 mL via INTRAVENOUS

## 2010-08-12 MED ORDER — ONDANSETRON HCL 4 MG PO TABS: 4.0000 mg | ORAL_TABLET | Freq: Four times a day (QID) | ORAL | Status: AC

## 2010-08-12 MED ORDER — DEXTROSE 5 % IV SOLN
INTRAVENOUS | Status: AC
  Filled 2010-08-12: qty 1

## 2010-08-12 MED ORDER — ONDANSETRON HCL 4 MG/2ML IJ SOLN
4.0000 mg | Freq: Once | INTRAMUSCULAR | Status: AC
  Administered 2010-08-12: 4 mg via INTRAVENOUS
  Filled 2010-08-12: qty 2

## 2010-08-12 MED ORDER — POTASSIUM CHLORIDE CRYS ER 20 MEQ PO TBCR
40.0000 meq | EXTENDED_RELEASE_TABLET | Freq: Once | ORAL | Status: AC
  Administered 2010-08-12: 40 meq via ORAL
  Filled 2010-08-12: qty 2

## 2010-08-12 NOTE — ED Notes (Signed)
Patient with no complaints at this time. Respirations even and unlabored. Skin warm/dry. Discharge instructions reviewed with patient at this time. Patient given opportunity to voice concerns/ask questions. IV removed per policy and band-aid applied to site. Patient discharged at this time and left Emergency Department with steady gait.  

## 2010-08-12 NOTE — ED Provider Notes (Signed)
History     Chief Complaint  Patient presents with  . Abdominal Pain   Patient is a 30 y.o. female presenting with abdominal pain. The history is provided by the patient.  Abdominal Pain The primary symptoms of the illness include abdominal pain, nausea, vomiting and diarrhea. The primary symptoms of the illness do not include fever, shortness of breath, hematochezia, dysuria, vaginal discharge or vaginal bleeding. The current episode started 3 to 5 hours ago. The problem has not changed since onset. The illness is associated with awakening from sleep. The patient states that she believes she is currently not pregnant. The patient has had a change in bowel habit. Additional symptoms associated with the illness include chills. Symptoms associated with the illness do not include constipation, urgency, hematuria, frequency or back pain. Significant associated medical issues do not include PUD, GERD, diabetes, gallstones or cardiac disease.    History reviewed. No pertinent past medical history.  Past Surgical History  Procedure Date  . Cesarean section     History reviewed. No pertinent family history.  History  Substance Use Topics  . Smoking status: Current Everyday Smoker  . Smokeless tobacco: Not on file  . Alcohol Use: Yes     OCC    OB History    Grav Para Term Preterm Abortions TAB SAB Ect Mult Living                  Review of Systems  Constitutional: Positive for chills. Negative for fever and activity change.       All ROS Neg except as noted in HPI  HENT: Negative for nosebleeds and neck pain.   Eyes: Negative for photophobia and discharge.  Respiratory: Negative for cough, shortness of breath and wheezing.   Cardiovascular: Negative for chest pain and palpitations.  Gastrointestinal: Positive for nausea, vomiting, abdominal pain and diarrhea. Negative for constipation, blood in stool and hematochezia.  Genitourinary: Negative for dysuria, urgency, frequency,  hematuria, vaginal bleeding and vaginal discharge.  Musculoskeletal: Negative for back pain and arthralgias.  Skin: Negative.   Neurological: Negative for dizziness, seizures and speech difficulty.  Psychiatric/Behavioral: Negative for hallucinations and confusion.    Physical Exam  BP 134/79  Pulse 54  Temp(Src) 98.3 F (36.8 C) (Oral)  Resp 16  Ht 5\' 3"  (1.6 m)  Wt 184 lb (83.462 kg)  BMI 32.59 kg/m2  SpO2 100%  LMP 08/01/2010  Physical Exam  Nursing note and vitals reviewed. Constitutional: She is oriented to person, place, and time. She appears well-developed and well-nourished.  Non-toxic appearance.  HENT:  Head: Normocephalic.  Right Ear: Tympanic membrane and external ear normal.  Left Ear: Tympanic membrane and external ear normal.  Eyes: EOM and lids are normal. Pupils are equal, round, and reactive to light.  Neck: Normal range of motion. Neck supple. Carotid bruit is not present.  Cardiovascular: Normal rate, regular rhythm, normal heart sounds, intact distal pulses and normal pulses.   Pulmonary/Chest: Breath sounds normal. No respiratory distress.  Abdominal: Soft. Bowel sounds are normal. There is tenderness in the left lower quadrant. There is no rigidity, no guarding and no CVA tenderness.  Musculoskeletal: Normal range of motion.  Lymphadenopathy:       Head (right side): No submandibular adenopathy present.       Head (left side): No submandibular adenopathy present.    She has no cervical adenopathy.  Neurological: She is alert and oriented to person, place, and time. She has normal strength. No cranial nerve  deficit or sensory deficit.  Skin: Skin is warm and dry.  Psychiatric: She has a normal mood and affect. Her speech is normal.    ED Course  Procedures  MDM I have reviewed nursing notes, vital signs, and all appropriate lab and imaging results for this patient.      Kathie Dike, PA 08/12/10 1508  Kathie Dike, [place] 08/12/10 [phone]

## 2010-08-12 NOTE — ED Notes (Signed)
Warm blanket provided for patient comfort.

## 2010-08-12 NOTE — ED Notes (Signed)
Pt states LLQ pain and vomiting began this morning, along with chills. NAD at this time. Pt nauseated in triage.

## 2010-08-12 NOTE — ED Notes (Signed)
Patient reports she is unable to urinate at this time. Pt gave verbal understanding that urine sample is needed.

## 2010-08-12 NOTE — ED Notes (Signed)
Patient actively vomiting. VO with readback obtained from Dr Preston Fleeting for 4 mg Zofran IV.

## 2010-08-13 LAB — URINE CULTURE
Colony Count: 35000
Culture  Setup Time: 201207212029

## 2010-08-13 NOTE — ED Provider Notes (Signed)
Evaluation and management procedures were performed by the PA/NP under my supervision/collaboration.   Dione Booze, MD 08/13/10 2045

## 2010-11-02 LAB — COMPREHENSIVE METABOLIC PANEL
ALT: 14
AST: 15
Calcium: 8.6
Creatinine, Ser: 0.6
GFR calc Af Amer: >60
Sodium: 139
Total Protein: 6.7

## 2010-11-02 LAB — CBC
MCHC: 33.8
MCV: 93.3
Platelets: 228
RDW: 13.6

## 2010-11-02 LAB — DIFFERENTIAL
Eosinophils Absolute: 0.3
Eosinophils Relative: 5
Lymphocytes Relative: 52 — ABNORMAL HIGH
Lymphs Abs: 2.8
Monocytes Relative: 6

## 2011-04-26 ENCOUNTER — Other Ambulatory Visit: Payer: Self-pay | Admitting: Adult Health

## 2011-04-26 DIAGNOSIS — N63 Unspecified lump in unspecified breast: Secondary | ICD-10-CM

## 2011-05-09 ENCOUNTER — Ambulatory Visit (HOSPITAL_COMMUNITY)
Admission: RE | Admit: 2011-05-09 | Discharge: 2011-05-09 | Disposition: A | Discharge status: Home / Self Care | Payer: Medicaid Other | Source: Ambulatory Visit | Attending: Adult Health | Admitting: Adult Health

## 2011-05-09 ENCOUNTER — Other Ambulatory Visit: Payer: Self-pay | Admitting: Adult Health

## 2011-05-09 ENCOUNTER — Other Ambulatory Visit (HOSPITAL_COMMUNITY): Payer: Self-pay | Admitting: Adult Health

## 2011-05-09 DIAGNOSIS — N63 Unspecified lump in unspecified breast: Secondary | ICD-10-CM

## 2012-05-29 ENCOUNTER — Encounter: Payer: Self-pay | Admitting: *Deleted

## 2012-05-30 ENCOUNTER — Other Ambulatory Visit: Payer: Self-pay | Admitting: *Deleted

## 2012-05-30 ENCOUNTER — Ambulatory Visit: Payer: Self-pay | Admitting: Adult Health

## 2012-05-30 DIAGNOSIS — M549 Dorsalgia, unspecified: Secondary | ICD-10-CM

## 2012-06-12 ENCOUNTER — Other Ambulatory Visit: Payer: Self-pay | Admitting: *Deleted

## 2012-06-12 DIAGNOSIS — M5136 Other intervertebral disc degeneration, lumbar region: Secondary | ICD-10-CM

## 2012-06-18 ENCOUNTER — Ambulatory Visit
Admission: RE | Admit: 2012-06-18 | Discharge: 2012-06-18 | Disposition: A | Discharge status: Home / Self Care | Payer: Medicaid Other | Source: Ambulatory Visit | Attending: *Deleted | Admitting: *Deleted

## 2012-06-18 DIAGNOSIS — M5136 Other intervertebral disc degeneration, lumbar region: Secondary | ICD-10-CM

## 2012-09-18 ENCOUNTER — Other Ambulatory Visit: Payer: Self-pay | Admitting: Adult Health

## 2013-02-13 ENCOUNTER — Other Ambulatory Visit: Payer: Self-pay | Admitting: Adult Health

## 2013-03-09 ENCOUNTER — Other Ambulatory Visit: Payer: Self-pay | Admitting: Adult Health

## 2013-03-13 ENCOUNTER — Other Ambulatory Visit: Payer: Self-pay | Admitting: Adult Health

## 2013-11-23 ENCOUNTER — Encounter: Payer: Self-pay | Admitting: *Deleted

## 2013-12-07 ENCOUNTER — Other Ambulatory Visit: Payer: Self-pay | Admitting: Adult Health

## 2014-01-04 ENCOUNTER — Other Ambulatory Visit: Payer: Self-pay | Admitting: Adult Health

## 2014-10-01 ENCOUNTER — Other Ambulatory Visit: Payer: Self-pay | Admitting: Adult Health

## 2015-04-08 ENCOUNTER — Ambulatory Visit (INDEPENDENT_AMBULATORY_CARE_PROVIDER_SITE_OTHER): Payer: BLUE CROSS/BLUE SHIELD | Admitting: Adult Health

## 2015-04-08 ENCOUNTER — Other Ambulatory Visit (HOSPITAL_COMMUNITY): Admission: RE | Admit: 2015-04-08 | Payer: BLUE CROSS/BLUE SHIELD | Source: Ambulatory Visit

## 2015-04-08 ENCOUNTER — Encounter: Payer: Self-pay | Admitting: Adult Health

## 2015-04-08 ENCOUNTER — Other Ambulatory Visit (HOSPITAL_COMMUNITY)
Admission: RE | Admit: 2015-04-08 | Discharge: 2015-04-08 | Disposition: A | Discharge status: Home / Self Care | Payer: BLUE CROSS/BLUE SHIELD | Source: Ambulatory Visit | Attending: Adult Health | Admitting: Adult Health

## 2015-04-08 VITALS — BP 140/90 | HR 74 | Ht 63.75 in | Wt 201.0 lb

## 2015-04-08 DIAGNOSIS — N946 Dysmenorrhea, unspecified: Secondary | ICD-10-CM

## 2015-04-08 DIAGNOSIS — F17200 Nicotine dependence, unspecified, uncomplicated: Secondary | ICD-10-CM

## 2015-04-08 DIAGNOSIS — R102 Pelvic and perineal pain: Secondary | ICD-10-CM

## 2015-04-08 DIAGNOSIS — Z01419 Encounter for gynecological examination (general) (routine) without abnormal findings: Secondary | ICD-10-CM | POA: Insufficient documentation

## 2015-04-08 DIAGNOSIS — Z113 Encounter for screening for infections with a predominantly sexual mode of transmission: Secondary | ICD-10-CM | POA: Insufficient documentation

## 2015-04-08 DIAGNOSIS — Z1151 Encounter for screening for human papillomavirus (HPV): Secondary | ICD-10-CM | POA: Diagnosis present

## 2015-04-08 DIAGNOSIS — R350 Frequency of micturition: Secondary | ICD-10-CM | POA: Insufficient documentation

## 2015-04-08 DIAGNOSIS — N921 Excessive and frequent menstruation with irregular cycle: Secondary | ICD-10-CM

## 2015-04-08 HISTORY — DX: Dysmenorrhea, unspecified: N94.6

## 2015-04-08 HISTORY — DX: Frequency of micturition: R35.0

## 2015-04-08 HISTORY — DX: Pelvic and perineal pain: R10.2

## 2015-04-08 HISTORY — DX: Nicotine dependence, unspecified, uncomplicated: F17.200

## 2015-04-08 HISTORY — DX: Excessive and frequent menstruation with irregular cycle: N92.1

## 2015-04-08 NOTE — Progress Notes (Signed)
Patient ID: [name], female   DOB: 1980-04-10, 35 y.o.   MRN: [id number] History of Present Illness: Diane FellingCarla is a 35 year old black female, in for a well woman gyn exam and pap.She is complaining of pelvic pain and low abdominal pain and urinary frequency.She also says periods are heavy 4 out of 7 days with cramps and she vomits at times and has missed work.She says periods are irregular too, but never misses one.She is sp tubal and she is a smoker.She has sharp throbbing pain before orgasm but can happen anytime. PCP is Dr [name].   Current Medications, Allergies, Past Medical History, Past Surgical History, Family History and Social History were reviewed in [facility] electronic medical record.     Review of Systems: Patient denies any headaches, hearing loss, fatigue, blurred vision, shortness of breath, chest pain,  problems with bowel movements, urination, or intercourse. No joint pain or mood swings.See HPI for positives.She says she had labs with Dr [name].    Physical Exam:BP 140/90 mmHg  Pulse 74  Ht 5' 3.75" (1.619 m)  Wt 201 lb (91.173 kg)  BMI 34.78 kg/m2  LMP 03/11/2015 General:  Well developed, well nourished, no acute distress Skin:  Warm and dry Neck:  Midline trachea, normal thyroid, good ROM, no lymphadenopathy Lungs; Clear to auscultation bilaterally Breast:  No dominant palpable mass, retraction, or nipple discharge Cardiovascular: Regular rate and rhythm Abdomen:  Soft, non tender, no hepatosplenomegaly Pelvic:  External genitalia is normal in appearance, no lesions.  The vagina is normal in appearance. Urethra has no lesions or masses. The cervix is bulbous, has period like blood, pap with GC/CHL and HPV performed.  Uterus is felt to be normal size, shape, and contour.  No adnexal masses or tenderness noted.Bladder is mildly tender, no masses felt. Extremities/musculoskeletal:  No swelling or varicosities noted, no clubbing or cyanosis Psych:  No mood  changes, alert and cooperative,seems happy Discussed IUD, ablation or hysterectomy as options for period control, she is interested in ablation, will get US in 2 weeks, when period over.   Impression: Well woman gyn exam and pap Menorrhagia with irregular cycle Smoker Urinary frequency Pelvic pain Dysmenorrhea     Plan: Return in 2 weeks for gyn US Physical in 1 year, pap in 3 if normal Mammogram at 39 Review handouts on menorrhagia, dysmenorrhea and ablation

## 2015-04-08 NOTE — Patient Instructions (Signed)
Dysmenorrhea Menstrual cramps (dysmenorrhea) are caused by the muscles of the uterus tightening (contracting) during a menstrual period. For some women, this discomfort is merely bothersome. For others, dysmenorrhea can be severe enough to interfere with everyday activities for a few days each month. Primary dysmenorrhea is menstrual cramps that last a couple of days when you start having menstrual periods or soon after. This often begins after a teenager starts having her period. As a woman gets older or has a baby, the cramps will usually lessen or disappear. Secondary dysmenorrhea begins later in life, lasts longer, and the pain may be stronger than primary dysmenorrhea. The pain may start before the period and last a few days after the period.  CAUSES  Dysmenorrhea is usually caused by an underlying problem, such as:  The tissue lining the uterus grows outside of the uterus in other areas of the body (endometriosis).  The endometrial tissue, which normally lines the uterus, is found in or grows into the muscular walls of the uterus (adenomyosis).  The pelvic blood vessels are engorged with blood just before the menstrual period (pelvic congestive syndrome).  Overgrowth of cells (polyps) in the lining of the uterus or cervix.  Falling down of the uterus (prolapse) because of loose or stretched ligaments.  Depression.  Bladder problems, infection, or inflammation.  Problems with the intestine, a tumor, or irritable bowel syndrome.  Cancer of the female organs or bladder.  A severely tipped uterus.  A very tight opening or closed cervix.  Noncancerous tumors of the uterus (fibroids).  Pelvic inflammatory disease (PID).  Pelvic scarring (adhesions) from a previous surgery.  Ovarian cyst.  An intrauterine device (IUD) used for birth control. RISK FACTORS You may be at greater risk of dysmenorrhea if:  You are younger than age 62.  You started puberty early.  You have  irregular or heavy bleeding.  You have never given birth.  You have a family history of this problem.  You are a smoker. SIGNS AND SYMPTOMS   Cramping or throbbing pain in your lower abdomen.  Headaches.  Lower back pain.  Nausea or vomiting.  Diarrhea.  Sweating or dizziness.  Loose stools. DIAGNOSIS  A diagnosis is based on your history, symptoms, physical exam, diagnostic tests, or procedures. Diagnostic tests or procedures may include:  Blood tests.  Ultrasonography.  An examination of the lining of the uterus (dilation and curettage, D&C).  An examination inside your abdomen or pelvis with a scope (laparoscopy).  X-rays.  CT scan.  MRI.  An examination inside the bladder with a scope (cystoscopy).  An examination inside the intestine or stomach with a scope (colonoscopy, gastroscopy). TREATMENT  Treatment depends on the cause of the dysmenorrhea. Treatment may include:  Pain medicine prescribed by your health care provider.  Birth control pills or an IUD with progesterone hormone in it.  Hormone replacement therapy.  Nonsteroidal anti-inflammatory drugs (NSAIDs). These may help stop the production of prostaglandins.  Surgery to remove adhesions, endometriosis, ovarian cyst, or fibroids.  Removal of the uterus (hysterectomy).  Progesterone shots to stop the menstrual period.  Cutting the nerves on the sacrum that go to the female organs (presacral neurectomy).  Electric current to the sacral nerves (sacral nerve stimulation).  Antidepressant medicine.  Psychiatric therapy, counseling, or group therapy.  Exercise and physical therapy.  Meditation and yoga therapy.  Acupuncture. HOME CARE INSTRUCTIONS   Only take over-the-counter or prescription medicines as directed by your health care provider.  Place a heating pad  or hot water bottle on your lower back or abdomen. Do not sleep with the heating pad.  Use aerobic exercises, walking,  swimming, biking, and other exercises to help lessen the cramping.  Massage to the lower back or abdomen may help.  Stop smoking.  Avoid alcohol and caffeine. SEEK MEDICAL CARE IF:   Your pain does not get better with medicine.  You have pain with sexual intercourse.  Your pain increases and is not controlled with medicines.  You have abnormal vaginal bleeding with your period.  You develop nausea or vomiting with your period that is not controlled with medicine. SEEK IMMEDIATE MEDICAL CARE IF:  You pass out.    This information is not intended to replace advice given to you by your health care provider. Make sure you discuss any questions you have with your health care provider.   Document Released: 01/08/2005 Document Revised: 09/10/2012 Document Reviewed: 06/26/2012 Elsevier Interactive Patient Education 2016 [employer]. Menorrhagia Menorrhagia is the medical term for when your menstrual periods are heavy or last longer than usual. With menorrhagia, every period you have may cause enough blood loss and cramping that you are unable to maintain your usual activities. CAUSES  In some cases, the cause of heavy periods is unknown, but a number of conditions may cause menorrhagia. Common causes include:  A problem with the hormone-producing thyroid gland (hypothyroid).  Noncancerous growths in the uterus (polyps or fibroids).  An imbalance of the estrogen and progesterone hormones.  One of your ovaries not releasing an egg during one or more months.  Side effects of having an intrauterine device (IUD).  Side effects of some medicines, such as anti-inflammatory medicines or blood thinners.  A bleeding disorder that stops your blood from clotting normally. SIGNS AND SYMPTOMS  During a normal period, bleeding lasts between 4 and 8 days. Signs that your periods are too heavy include:  You routinely have to change your pad or tampon every 1 or 2 hours because it is completely  soaked.  You pass blood clots larger than 1 inch (2.5 cm) in size.  You have bleeding for more than 7 days.  You need to use pads and tampons at the same time because of heavy bleeding.  You need to wake up to change your pads or tampons during the night.  You have symptoms of anemia, such as tiredness, fatigue, or shortness of breath. DIAGNOSIS  Your health care provider will perform a physical exam and ask you questions about your symptoms and menstrual history. Other tests may be ordered based on what the health care provider finds during the exam. These tests can include:  Blood tests. Blood tests are used to check if you are pregnant or have hormonal changes, a bleeding or thyroid disorder, low iron levels (anemia), or other problems.  Endometrial biopsy. Your health care provider takes a sample of tissue from the inside of your uterus to be examined under a microscope.  Pelvic ultrasound. This test uses sound waves to make a picture of your uterus, ovaries, and vagina. The pictures can show if you have fibroids or other growths.  Hysteroscopy. For this test, your health care provider will use a small telescope to look inside your uterus. Based on the results of your initial tests, your health care provider may recommend further testing. TREATMENT  Treatment may not be needed. If it is needed, your health care provider may recommend treatment with one or more medicines first. If these do not reduce  bleeding enough, a surgical treatment might be an option. The best treatment for you will depend on:   Whether you need to prevent pregnancy.  Your desire to have children in the future.  The cause and severity of your bleeding.  Your opinion and personal preference.  Medicines for menorrhagia may include:  Birth control methods that use hormones. These include the pill, skin patch, vaginal ring, shots that you get every 3 months, hormonal IUD, and implant. These treatments  reduce bleeding during your menstrual period.  Medicines that thicken blood and slow bleeding.  Medicines that reduce swelling, such as ibuprofen.  Medicines that contain a synthetic hormone called progestin.   Medicines that make the ovaries stop working for a short time.  You may need surgical treatment for menorrhagia if the medicines are unsuccessful. Treatment options include:  Dilation and curettage (D&C). In this procedure, your health care provider opens (dilates) your cervix and then scrapes or suctions tissue from the lining of your uterus to reduce menstrual bleeding.  Operative hysteroscopy. This procedure uses a tiny tube with a light (hysteroscope) to view your uterine cavity and can help in the surgical removal of a polyp that may be causing heavy periods.  Endometrial ablation. Through various techniques, your health care provider permanently destroys the entire lining of your uterus (endometrium). After endometrial ablation, most women have little or no menstrual flow. Endometrial ablation reduces your ability to become pregnant.  Endometrial resection. This surgical procedure uses an electrosurgical wire loop to remove the lining of the uterus. This procedure also reduces your ability to become pregnant.  Hysterectomy. Surgical removal of the uterus and cervix is a permanent procedure that stops menstrual periods. Pregnancy is not possible after a hysterectomy. This procedure requires anesthesia and hospitalization. HOME CARE INSTRUCTIONS   Only take over-the-counter or prescription medicines as directed by your health care provider. Take prescribed medicines exactly as directed. Do not change or switch medicines without consulting your health care provider.  Take any prescribed iron pills exactly as directed by your health care provider. Long-term heavy bleeding may result in low iron levels. Iron pills help replace the iron your body lost from heavy bleeding. Iron may  cause constipation. If this becomes a problem, increase the bran, fruits, and roughage in your diet.  Do not take aspirin or medicines that contain aspirin 1 week before or during your menstrual period. Aspirin may make the bleeding worse.  If you need to change your sanitary pad or tampon more than once every 2 hours, stay in bed and rest as much as possible until the bleeding stops.  Eat well-balanced meals. Eat foods high in iron. Examples are leafy green vegetables, meat, liver, eggs, and whole grain breads and cereals. Do not try to lose weight until the abnormal bleeding has stopped and your blood iron level is back to normal. SEEK MEDICAL CARE IF:   You soak through a pad or tampon every 1 or 2 hours, and this happens every time you have a period.  You need to use pads and tampons at the same time because you are bleeding so much.  You need to change your pad or tampon during the night.  You have a period that lasts for more than 8 days.  You pass clots bigger than 1 inch wide.  You have irregular periods that happen more or less often than once a month.  You feel dizzy or faint.  You feel very weak or tired.  You  feel short of breath or feel your heart is beating too fast when you exercise.  You have nausea and vomiting or diarrhea while you are taking your medicine.  You have any problems that may be related to the medicine you are taking. SEEK IMMEDIATE MEDICAL CARE IF:   You soak through 4 or more pads or tampons in 2 hours.  You have any bleeding while you are pregnant. MAKE SURE YOU:   Understand these instructions.  Will watch your condition.  Will get help right away if you are not doing well or get worse.   This information is not intended to replace advice given to you by your health care provider. Make sure you discuss any questions you have with your health care provider.   Document Released: 01/08/2005 Document Revised: 01/13/2013 Document Reviewed:  06/29/2012 Elsevier Interactive Patient Education [employer]2016 Else[employer] Inc. Return in 2 weeks for US Physical in 1 year, pap in 3 if normal Mammogram at 39

## 2015-04-12 LAB — CYTOLOGY - PAP

## 2015-04-22 ENCOUNTER — Ambulatory Visit (INDEPENDENT_AMBULATORY_CARE_PROVIDER_SITE_OTHER): Payer: BLUE CROSS/BLUE SHIELD

## 2015-04-22 DIAGNOSIS — N921 Excessive and frequent menstruation with irregular cycle: Secondary | ICD-10-CM

## 2015-04-22 DIAGNOSIS — N946 Dysmenorrhea, unspecified: Secondary | ICD-10-CM | POA: Diagnosis not present

## 2015-04-22 DIAGNOSIS — N854 Malposition of uterus: Secondary | ICD-10-CM

## 2015-04-22 DIAGNOSIS — R102 Pelvic and perineal pain: Secondary | ICD-10-CM

## 2015-04-22 DIAGNOSIS — N888 Other specified noninflammatory disorders of cervix uteri: Secondary | ICD-10-CM

## 2015-04-22 NOTE — Progress Notes (Signed)
PELVIC US TA/TV: normal homogenous anteverted uterus,normal ov's bilat (mobile),bilat adnexal pain during ultrasound,no free fluid,EEC 10.9 mm,mult simple nabothian cysts

## 2015-04-27 ENCOUNTER — Telehealth: Payer: Self-pay | Admitting: Adult Health

## 2015-04-27 NOTE — Telephone Encounter (Signed)
Pt called and is aware US normal but if wants ablation make appt with MD

## 2015-04-27 NOTE — Telephone Encounter (Signed)
Left message US was normal, call if wants to discuss further

## 2015-05-13 ENCOUNTER — Ambulatory Visit (INDEPENDENT_AMBULATORY_CARE_PROVIDER_SITE_OTHER): Payer: BLUE CROSS/BLUE SHIELD | Admitting: Obstetrics and Gynecology

## 2015-05-13 ENCOUNTER — Encounter: Payer: Self-pay | Admitting: Obstetrics and Gynecology

## 2015-05-13 VITALS — BP 132/90 | HR 68 | Wt 198.8 lb

## 2015-05-13 DIAGNOSIS — N921 Excessive and frequent menstruation with irregular cycle: Secondary | ICD-10-CM | POA: Diagnosis not present

## 2015-05-13 NOTE — Progress Notes (Signed)
[facility] Visit  @DATE @            Patient name: [name] MRN [id number]  Date of birth: 1980/07/16  CC & HPI:  [name] is a 35 y.o. female presenting today for discussion regarding endometrial ablation. Pt reports that she has to miss work some days due to her heavy periods. Pt states that she has had a tubal ligation at this time. Pt notes that she has two children age 36 and 911 and she had them through C-section. Pt denies any other symptoms. Patient's last menstrual period was 03/31/2015.  ROS:  ROS +Menorrhagia +Dysmenorrhea  Pertinent History Reviewed:   Reviewed: Significant for  Medical         Past Medical History  Diagnosis Date  . Migraines   . History of gonorrhea   . History of chlamydia   . History of trichomoniasis   . Abnormal pap 2009, 2010  . Rheumatoid arthritis(714.0)   . History of frequent urinary tract infections   . Fibromyalgia   . Hypertension   . Smoker 04/08/2015  . Vaginal Pap smear, abnormal   . Urinary frequency 04/08/2015  . Pelvic pain in female 04/08/2015  . Menorrhagia with irregular cycle 04/08/2015  . Dysmenorrhea 04/08/2015                              Surgical Hx:    Past Surgical History  Procedure Laterality Date  . Cesarean section    . Tubal ligation     Medications: Reviewed & Updated - see associated section                       Current outpatient prescriptions:  .  LISINOPRIL PO, Take by mouth as needed., Disp: , Rfl:  .  oxyCODONE-acetaminophen (PERCOCET) 10-325 MG tablet, Take 1 tablet by mouth every 4 (four) hours as needed for pain., Disp: , Rfl:    Social History: Reviewed -  reports that she has been smoking Cigarettes.  She has a 5 pack-year smoking history. She has never used smokeless tobacco.  Objective Findings:  Vitals: Blood pressure 132/90, pulse 68, weight 198 lb 12.8 oz (90.175 kg), last menstrual period 03/31/2015.  Physical Examination:  General appearance - alert, well  appearing, and in no distress and oriented to person, place, and time Mental status - alert, oriented to person, place, and time, normal mood, behavior, speech, dress, motor activity, and thought processes  Discussed with pt risks and benefits of IUD vs. endometrial ablation. At end of discussion, pt had opportunity to ask questions and has no further questions at this time.   Greater than 50% was spent in counseling and coordination of care with the patient. Face-to-face time greater than: 10 minutes=total time 20 minutes US reviewed:    .lheGYNECOLOGIC SONOGRAM   [name] is a 35 y.o. G2P2 LMP 04/08/2015 for a pelvic sonogram for menorrhagia,dysmenorrhea w/pelvis pain.  Uterus 8.6 x 4.9 x 7.3 cm, normal homogenous anteverted uterus  Endometrium 10.9 mm, symmetrical, wnl  Right ovary 3.1 x 3.2 x 1.5 cm, wnl  Left ovary 4.5 x 2.6 x 1.9 cm, wnl  No free fluid   Technician Comments:  PELVIC US TA/TV: normal homogenous anteverted uterus,normal ov's bilat (mobile),bilat adnexal pain during ultrasound,no free fluid,EEC 10.9 mm,mult. simple nabothian cysts    Karie Chimeramber J Carl 04/22/2015 9:40 AM  Clinical Impression  and recommendations:  I have reviewed the sonogram results above, combined with the patient's current clinical course, below are my impressions and any appropriate recommendations for management based on the sonographic findings.  Normal uterus, endometrium and ovaries No anatomical etiology noted as source of pelvic pain   EURE,LUTHER H  Assessment & Plan:   A:  1. Menorrhagia with irregular cycle 2. Dysmenorrhea  P:  1. Follow up in 2 weeks for pre-op visit for endometrial ablation exam and scheduling     I personally performed the services described in this documentation, which was SCRIBED in my presence. The recorded information has been reviewed and considered accurate. It has been edited as  necessary during review. Tilda Burrow, MD     By signing my name below, I, Soijett Blue, attest that this documentation has been prepared under the direction and in the presence of Tilda Burrow, MD. Electronically Signed: Soijett Blue, ED Scribe. 05/13/2015. 11:44 AM.

## 2015-05-20 ENCOUNTER — Encounter: Payer: BLUE CROSS/BLUE SHIELD | Admitting: Obstetrics and Gynecology

## 2015-05-25 ENCOUNTER — Encounter: Payer: Self-pay | Admitting: Obstetrics and Gynecology

## 2015-05-25 ENCOUNTER — Ambulatory Visit (INDEPENDENT_AMBULATORY_CARE_PROVIDER_SITE_OTHER): Payer: BLUE CROSS/BLUE SHIELD | Admitting: Obstetrics and Gynecology

## 2015-05-25 VITALS — BP 120/80 | Ht 63.0 in | Wt 200.0 lb

## 2015-05-25 DIAGNOSIS — Z01818 Encounter for other preprocedural examination: Secondary | ICD-10-CM

## 2015-05-25 DIAGNOSIS — N921 Excessive and frequent menstruation with irregular cycle: Secondary | ICD-10-CM

## 2015-05-25 NOTE — Progress Notes (Signed)
Patient ID: [name], female   DOB: 31-May-1980, 35 y.o.   MRN: [id number]  Preoperative History and Physical  [name] is a 35 y.o. G2P2 here for surgical management of dysmenorrhea, menorrhagia and irregular cycles. She reports her cycles range from 5 days- 2 weeks. She notes her pain causes her to miss work. No significant preoperative concerns.  Proposed surgery: hysteroscopy D&C,endometrial ablation   Past Medical History  Diagnosis Date   Migraines    History of gonorrhea    History of chlamydia    History of trichomoniasis    Abnormal pap 2009, 2010   Rheumatoid arthritis(714.0)    History of frequent urinary tract infections    Fibromyalgia    Hypertension    Smoker 04/08/2015   Vaginal Pap smear, abnormal    Urinary frequency 04/08/2015   Pelvic pain in female 04/08/2015   Menorrhagia with irregular cycle 04/08/2015   Dysmenorrhea 04/08/2015   Past Surgical History  Procedure Laterality Date   Cesarean section     Tubal ligation     OB History  Gravida Para Term Preterm AB SAB TAB Ectopic Multiple Living  2 2            # Outcome Date GA Lbr Len/2nd Weight Sex Delivery Anes PTL Lv  2 Para 2006    M CS-Unspec     1 Para 2000    F CS-Unspec       Patient denies any other pertinent gynecologic issues.   Current Outpatient Prescriptions on File Prior to Visit  Medication Sig Dispense Refill   LISINOPRIL PO Take by mouth as needed.     oxyCODONE-acetaminophen (PERCOCET) 10-325 MG tablet Take 1 tablet by mouth every 4 (four) hours as needed for pain.     No current facility-administered medications on file prior to visit.   No Known Allergies  Social History:   reports that she has been smoking Cigarettes.  She has a 5 pack-year smoking history. She has never used smokeless tobacco. She reports that she drinks alcohol. She reports that she does not use illicit drugs.  Family History  Problem Relation Age of Onset   Cancer Mother      cervical   Cancer Maternal Aunt     breast    Hypertension Maternal Aunt    Cancer Maternal Grandmother     colon and uterine   Cancer Maternal Aunt     colon   Migraines Father    Gallstones Sister    Cancer Maternal Grandfather    Cancer Paternal Grandmother    Cancer Paternal Grandfather     Review of Systems: Noncontributory  PHYSICAL EXAM: Blood pressure 120/80, height 5\' 3"  (1.6 m), weight 200 lb (90.719 kg), last menstrual period 05/04/2015. General appearance - alert, well appearing, and in no distress Eyes- PERRL  HENT- normocephalic, atraumatic  Chest - clear to auscultation, no wheezes, rales or rhonchi, symmetric air entry Heart - normal rate and regular rhythm Abdomen - soft, nontender, nondistended, no masses or organomegaly Pelvic - normal female external genitalia, vagina pink and rugated. No adnexal masses or tenderness. Cervix with normal appearance. Good support. Normal secretions. Uterus is mid-position, normal size, shape and contour.  Extremities - peripheral pulses normal, no pedal edema, no clubbing or cyanosis  Labs: No results found for this or any previous visit (from the past 336 hour(s)).  Imaging Studies: No results found.   Assessment: Patient Active Problem List   Diagnosis Date Noted  Smoker 04/08/2015   Urinary frequency 04/08/2015   Pelvic pain in female 04/08/2015   Menorrhagia with irregular cycle 04/08/2015   Dysmenorrhea 04/08/2015   H N P-LUMBAR 02/15/2009   DISC DEGENERATION 12/05/2006   SHOULDER PAIN 10/21/2006   BACK PAIN 10/21/2006   IMPINGEMENT SYNDROME 10/21/2006    Plan: Patient will undergo surgical management with endometrial ablation on 06/10/15.  No biopsy indicated as pt is <40 yrs.  GC/CHL collected today   .mec 05/25/2015 3:58 PM   By signing my name below, I, [name], attest that this documentation has been prepared under the direction and in the presence of Tilda Burrow,  MD. Electronically Signed: Doreatha Kaiser, ED Scribe. 05/25/2015. 3:58 PM.  I personally performed the services described in this documentation, which was SCRIBED in my presence. The recorded information has been reviewed and considered accurate. It has been edited as necessary during review. Tilda Burrow, MD

## 2015-05-25 NOTE — Progress Notes (Signed)
Patient ID: [name], female   DOB: 01-27-1980, 35 y.o.   MRN: [id number] Patient ID: [name], female   DOB: 1980-08-07, 35 y.o.   MRN: [id number]  Preoperative History and Physical  [name] is a 35 y.o. G2P2 here for surgical management of dysmenorrhea, menorrhagia and irregular cycles. She reports her cycles range from 5 days- 2 weeks. She notes her pain causes her to miss work. No significant preoperative concerns.  Proposed surgery: hysteroscopy D&C,endometrial ablation   Past Medical History  Diagnosis Date  . Migraines   . History of gonorrhea   . History of chlamydia   . History of trichomoniasis   . Abnormal pap 2009, 2010  . Rheumatoid arthritis(714.0)   . History of frequent urinary tract infections   . Fibromyalgia   . Hypertension   . Smoker 04/08/2015  . Vaginal Pap smear, abnormal   . Urinary frequency 04/08/2015  . Pelvic pain in female 04/08/2015  . Menorrhagia with irregular cycle 04/08/2015  . Dysmenorrhea 04/08/2015   Past Surgical History  Procedure Laterality Date  . Cesarean section    . Tubal ligation     OB History  Gravida Para Term Preterm AB SAB TAB Ectopic Multiple Living  2 2            # Outcome Date GA Lbr Len/2nd Weight Sex Delivery Anes PTL Lv  2 Para 2006    M CS-Unspec     1 Para 2000    F CS-Unspec       Patient denies any other pertinent gynecologic issues.   Current Outpatient Prescriptions on File Prior to Visit  Medication Sig Dispense Refill  . LISINOPRIL PO Take by mouth as needed.    Marland Kitchen oxyCODONE-acetaminophen (PERCOCET) 10-325 MG tablet Take 1 tablet by mouth every 4 (four) hours as needed for pain.     No current facility-administered medications on file prior to visit.   No Known Allergies  Social History:   reports that she has been smoking Cigarettes.  She has a 5 pack-year smoking history. She has never used smokeless tobacco. She reports that she drinks alcohol. She reports that she does not use  illicit drugs.  Family History  Problem Relation Age of Onset  . Cancer Mother     cervical  . Cancer Maternal Aunt     breast   . Hypertension Maternal Aunt   . Cancer Maternal Grandmother     colon and uterine  . Cancer Maternal Aunt     colon  . Migraines Father   . Gallstones Sister   . Cancer Maternal Grandfather   . Cancer Paternal Grandmother   . Cancer Paternal Grandfather     Review of Systems: Noncontributory  PHYSICAL EXAM: Blood pressure 120/80, height  (1.6 m), weight 200 lb (90.719 kg), last menstrual period 05/04/2015. General appearance - alert, well appearing, and in no distress Eyes- PERRL  HENT- normocephalic, atraumatic  Chest - clear to auscultation, no wheezes, rales or rhonchi, symmetric air entry Heart - normal rate and regular rhythm Abdomen - soft, nontender, nondistended, no masses or organomegaly Pelvic - normal female external genitalia, vagina pink and rugated. No adnexal masses or tenderness. Cervix with normal appearance. Good support. Normal secretions. Uterus is mid-position, normal size, shape and contour.  Extremities - peripheral pulses normal, no pedal edema, no clubbing or cyanosis  Labs: No results found for this or any previous visit (from the past 336 hour(s)).  Imaging Studies:  No results found.   Assessment: Patient Active Problem List   Diagnosis Date Noted  . Smoker 04/08/2015  . Urinary frequency 04/08/2015  . Pelvic pain in female 04/08/2015  . Menorrhagia with irregular cycle 04/08/2015  . Dysmenorrhea 04/08/2015  . H N P-LUMBAR 02/15/2009  . DISC DEGENERATION 12/05/2006  . SHOULDER PAIN 10/21/2006  . BACK PAIN 10/21/2006  . IMPINGEMENT SYNDROME 10/21/2006    Plan: Patient will undergo surgical management with endometrial ablation on 06/10/15.  No biopsy indicated as pt is <40 yrs.  GC/CHL collected today   .mec 05/25/2015 3:58 PM   By signing my name below, I, Doreatha MartinEva Mathews, attest that this documentation  has been prepared under the direction and in the presence of Tilda BurrowJohn Ruben Mahler V, MD. Electronically Signed: Doreatha MartinEva Mathews, ED Scribe. 05/25/2015. 3:58 PM.  I personally performed the services described in this documentation, which was SCRIBED in my presence. The recorded information has been reviewed and considered accurate. It has been edited as necessary during review. Tilda BurrowFERGUSON,Kamiyah Kindel V, MD

## 2015-05-27 LAB — GC/CHLAMYDIA PROBE AMP
Chlamydia trachomatis, NAA: NEGATIVE
NEISSERIA GONORRHOEAE BY PCR: NEGATIVE

## 2015-06-02 NOTE — Patient Instructions (Signed)
Diane AustriaCarla L Kaiser  06/02/2015     @PREFPERIOPPHARMACY @   Your procedure is scheduled on 06/10/15.  [facility]  Call this number if you have problems the morning of surgery:  [phone]   Remember:  Do not eat food or drink liquids after midnight.  Take these medicines the morning of surgery with A SIP OF WATER Percocet if needed   Do not wear jewelry, make-up or nail polish.  Do not wear lotions, powders, or perfumes.  You may wear deodorant.  Do not shave 48 hours prior to surgery.  Men may shave face and neck.  Do not bring valuables to the hospital.  [facility] any belongings or valuables.  Contacts, dentures or bridgework may not be worn into surgery.  Leave your suitcase in the car.  After surgery it may be brought to your room.  For patients admitted to the hospital, discharge time will be determined by your treatment team.  Patients discharged the day of surgery will not be allowed to drive home.   Please read over the following fact sheets that you were given. Surgical Site Infection Prevention and Anesthesia Post-op Instructions     PATIENT INSTRUCTIONS POST-ANESTHESIA  IMMEDIATELY FOLLOWING SURGERY:  Do not drive or operate machinery for the first twenty four hours after surgery.  Do not make any important decisions for twenty four hours after surgery or while taking narcotic pain medications or sedatives.  If you develop intractable nausea and vomiting or a severe headache please notify your doctor immediately.  FOLLOW-UP:  Please make an appointment with your surgeon as instructed. You do not need to follow up with anesthesia unless specifically instructed to do so.  WOUND CARE INSTRUCTIONS (if applicable):  Keep a dry clean dressing on the anesthesia/puncture wound site if there is drainage.  Once the wound has quit draining you may leave it open to air.  Generally you should leave the bandage intact for twenty four  hours unless there is drainage.  If the epidural site drains for more than 36-48 hours please call the anesthesia department.  QUESTIONS?:  Please feel free to call your physician or the hospital operator if you have any questions, and they will be happy to assist you.      Endometrial Ablation Endometrial ablation removes the lining of the uterus (endometrium). It is usually a same-day, outpatient treatment. Ablation helps avoid major surgery, such as surgery to remove the cervix and uterus (hysterectomy). After endometrial ablation, you will have little or no menstrual bleeding and may not be able to have children. However, if you are premenopausal, you will need to use a reliable method of birth control following the procedure because of the small chance that pregnancy can occur. There are different reasons to have this procedure. These reasons include:  Heavy periods.  Bleeding that is causing anemia.  Irregular bleeding.  Bleeding fibroids on the lining inside the uterus if they are smaller than 3 centimeters. This procedure may not be possible for you if:   You want to have children in the future.   You have severe cramps with your menstrual period.   You have precancerous or cancerous cells in your uterus.   You were recently pregnant.   You have gone through menopause.   You have had major surgery on your uterus, resulting in thinning of the uterine wall. Surgeries may include:  The removal of one or more uterine fibroids (  myomectomy).  A cesarean section with a classic (vertical) incision on your uterus. Ask your health care provider what type of cesarean you had. Sometimes the scar on your skin is different than the scar on your uterus. Even if you have had surgery on your uterus, certain types of ablation may still be safe for you. Talk with your health care provider. LET [facility] CARE PROVIDER KNOW ABOUT:  Any allergies you have.  All medicines you are  taking, including vitamins, herbs, eye drops, creams, and over-the-counter medicines.  Previous problems you or members of your family have had with the use of anesthetics.  Any blood disorders you have.  Previous surgeries you have had.  Medical conditions you have. RISKS AND COMPLICATIONS  Generally, this is a safe procedure. However, as with any procedure, complications can occur. Possible complications include:  Perforation of the uterus.  Bleeding.  Infection of the uterus, bladder, or vagina.  Injury to surrounding organs.  An air bubble to the lung (air embolus).  Pregnancy following the procedure.  Failure of the procedure to help the problem, requiring hysterectomy.  Decreased ability to diagnose cancer in the lining of the uterus. BEFORE THE PROCEDURE  The lining of the uterus must be tested to make sure there is no pre-cancerous or cancer cells present.  An ultrasound may be performed to look at the size of the uterus and to check for abnormalities.  Medicines may be given to thin the lining of the uterus. PROCEDURE  During the procedure, your health care provider will use a tool called a resectoscope to help see inside your uterus. There are different ways to remove the lining of your uterus.   Radiofrequency - This method uses a radiofrequency-alternating electric current to remove the lining of the uterus.  Cryotherapy - This method uses extreme cold to freeze the lining of the uterus.  Heated-Free Liquid - This method uses heated salt (saline) solution to remove the lining of the uterus.  Microwave - This method uses high-energy microwaves to heat up the lining of the uterus to remove it.  Thermal balloon - This method involves inserting a catheter with a balloon tip into the uterus. The balloon tip is filled with heated fluid to remove the lining of the uterus. AFTER THE PROCEDURE  After your procedure, do not have sexual intercourse or insert anything  into your vagina until permitted by your health care provider. After the procedure, you may experience:  Cramps.  Vaginal discharge.  Frequent urination.   This information is not intended to replace advice given to you by your health care provider. Make sure you discuss any questions you have with your health care provider.   Document Released: 11/18/2003 Document Revised: 09/29/2014 Document Reviewed: 06/11/2012 Elsevier Interactive Patient Education 2016 Elsevier Inc. Dilation and Curettage or Vacuum Curettage Dilation and curettage (D&C) and vacuum curettage are minor procedures. A D&C involves stretching (dilation) the cervix and scraping (curettage) the inside lining of the womb (uterus). During a D&C, tissue is gently scraped from the inside lining of the uterus. During a vacuum curettage, the lining and tissue in the uterus are removed with the use of gentle suction.  Curettage may be performed to either diagnose or treat a problem. As a diagnostic procedure, curettage is performed to examine tissues from the uterus. A diagnostic curettage may be performed for the following symptoms:   Irregular bleeding in the uterus.   Bleeding with the development of clots.   Spotting between  menstrual periods.   Prolonged menstrual periods.   Bleeding after menopause.   No menstrual period (amenorrhea).   A change in size and shape of the uterus.  As a treatment procedure, curettage may be performed for the following reasons:   Removal of an IUD (intrauterine device).   Removal of retained placenta after giving birth. Retained placenta can cause an infection or bleeding severe enough to require transfusions.   Abortion.   Miscarriage.   Removal of polyps inside the uterus.   Removal of uncommon types of noncancerous lumps (fibroids).  LET [facility] CARE PROVIDER KNOW ABOUT:   Any allergies you have.   All medicines you are taking, including vitamins, herbs,  eye drops, creams, and over-the-counter medicines.   Previous problems you or members of your family have had with the use of anesthetics.   Any blood disorders you have.   Previous surgeries you have had.   Medical conditions you have. RISKS AND COMPLICATIONS  Generally, this is a safe procedure. However, as with any procedure, complications can occur. Possible complications include:  Excessive bleeding.   Infection of the uterus.   Damage to the cervix.   Development of scar tissue (adhesions) inside the uterus, later causing abnormal amounts of menstrual bleeding.   Complications from the general anesthetic, if a general anesthetic is used.   Putting a hole (perforation) in the uterus. This is rare.  BEFORE THE PROCEDURE   Eat and drink before the procedure only as directed by your health care provider.   Arrange for someone to take you home.  PROCEDURE  This procedure usually takes about 15-30 minutes.  You will be given one of the following:  A medicine that numbs the area in and around the cervix (local anesthetic).   A medicine to make you sleep through the procedure (general anesthetic).  You will lie on your back with your legs in stirrups.   A warm metal or plastic instrument (speculum) will be placed in your vagina to keep it open and to allow the health care provider to see the cervix.  There are two ways in which your cervix can be softened and dilated. These include:   Taking a medicine.   Having thin rods (laminaria) inserted into your cervix.   A curved tool (curette) will be used to scrape cells from the inside lining of the uterus. In some cases, gentle suction is applied with the curette. The curette will then be removed.  AFTER THE PROCEDURE   You will rest in the recovery area until you are stable and are ready to go home.   You may feel sick to your stomach (nauseous) or throw up (vomit) if you were given a general  anesthetic.   You may have a sore throat if a tube was placed in your throat during general anesthesia.   You may have light cramping and bleeding. This may last for 2 days to 2 weeks after the procedure.   Your uterus needs to make a new lining after the procedure. This may make your next period late.   This information is not intended to replace advice given to you by your health care provider. Make sure you discuss any questions you have with your health care provider.   Document Released: 01/08/2005 Document Revised: 09/10/2012 Document Reviewed: 08/07/2012 Elsevier Interactive Patient Education [employer]

## 2015-06-03 ENCOUNTER — Encounter (HOSPITAL_COMMUNITY)
Admission: RE | Admit: 2015-06-03 | Discharge: 2015-06-03 | Disposition: A | Discharge status: Home / Self Care | Payer: Self-pay | Source: Ambulatory Visit | Attending: Obstetrics and Gynecology | Admitting: Obstetrics and Gynecology

## 2015-06-03 ENCOUNTER — Inpatient Hospital Stay (HOSPITAL_COMMUNITY): Admission: RE | Admit: 2015-06-03 | Payer: Self-pay | Source: Ambulatory Visit

## 2015-06-03 ENCOUNTER — Other Ambulatory Visit: Payer: Self-pay | Admitting: Obstetrics and Gynecology

## 2015-06-06 ENCOUNTER — Other Ambulatory Visit (HOSPITAL_COMMUNITY): Payer: Self-pay

## 2015-06-06 ENCOUNTER — Encounter (HOSPITAL_COMMUNITY): Admission: RE | Admit: 2015-06-06 | Payer: BLUE CROSS/BLUE SHIELD | Source: Ambulatory Visit

## 2015-06-10 ENCOUNTER — Encounter (HOSPITAL_COMMUNITY): Admission: RE | Payer: Self-pay | Source: Ambulatory Visit

## 2015-06-10 ENCOUNTER — Ambulatory Visit (HOSPITAL_COMMUNITY)
Admission: RE | Admit: 2015-06-10 | Payer: BLUE CROSS/BLUE SHIELD | Source: Ambulatory Visit | Admitting: Obstetrics and Gynecology

## 2015-06-10 SURGERY — DILATATION & CURETTAGE/HYSTEROSCOPY WITH NOVASURE ABLATION
Anesthesia: Choice

## 2015-06-17 ENCOUNTER — Encounter: Payer: BLUE CROSS/BLUE SHIELD | Admitting: Obstetrics and Gynecology

## 2015-06-17 ENCOUNTER — Encounter: Payer: Self-pay | Admitting: Obstetrics and Gynecology

## 2015-08-29 ENCOUNTER — Ambulatory Visit: Payer: BLUE CROSS/BLUE SHIELD | Admitting: Obstetrics and Gynecology

## 2015-09-09 ENCOUNTER — Ambulatory Visit (INDEPENDENT_AMBULATORY_CARE_PROVIDER_SITE_OTHER): Payer: BLUE CROSS/BLUE SHIELD | Admitting: Obstetrics and Gynecology

## 2015-09-09 ENCOUNTER — Encounter: Payer: Self-pay | Admitting: Obstetrics and Gynecology

## 2015-09-09 VITALS — BP 158/80 | HR 72 | Ht 63.0 in | Wt 196.0 lb

## 2015-09-09 DIAGNOSIS — N760 Acute vaginitis: Secondary | ICD-10-CM | POA: Diagnosis not present

## 2015-09-09 DIAGNOSIS — N921 Excessive and frequent menstruation with irregular cycle: Secondary | ICD-10-CM

## 2015-09-09 DIAGNOSIS — Z113 Encounter for screening for infections with a predominantly sexual mode of transmission: Secondary | ICD-10-CM | POA: Diagnosis not present

## 2015-09-09 DIAGNOSIS — A499 Bacterial infection, unspecified: Secondary | ICD-10-CM

## 2015-09-09 DIAGNOSIS — B9689 Other specified bacterial agents as the cause of diseases classified elsewhere: Secondary | ICD-10-CM | POA: Insufficient documentation

## 2015-09-09 MED ORDER — MEDROXYPROGESTERONE ACETATE 150 MG/ML IM SUSP: 150.0000 mg | Freq: Once | INTRAMUSCULAR | Status: DC

## 2015-09-09 MED ORDER — METRONIDAZOLE 500 MG PO TABS: 500.0000 mg | ORAL_TABLET | Freq: Two times a day (BID) | ORAL | 0 refills | Status: DC

## 2015-09-09 NOTE — Addendum Note (Signed)
Addended by: Criss AlvinePULLIAM, Mildred Bollard G on: 09/09/2015 11:02 AM   Modules accepted: Orders

## 2015-09-09 NOTE — Progress Notes (Signed)
[facility] Visit  09/09/15            Patient name: [name] MRN [id number]  Date of birth: May 07, 1980  CC & HPI:  [name] is a 35 y.o. female presenting today for ongoing, unchanged episodes of menorrhagia. She reports her last menstrual period ended on 08/27/15 and states it lasted for 6 days. She reports increased days of passing clots, states it occurred 3 out of 6 days.   She also complains of vaginal discharge for the past few days. She describes the discharge as a beige color but states it is odorless. She is sexually active with 1 partner, intermittently. She denies modifying factors. She denies other associated symptoms.   ROS:  ROS  +menorrhagia +vaginal discharge Otherwise negative   Pertinent History Reviewed:   Reviewed: Significant for abnormal pap, dysmenorrhea Medical         Past Medical History:  Diagnosis Date  . Abnormal pap 2009, 2010  . Dysmenorrhea 04/08/2015  . Fibromyalgia   . History of chlamydia   . History of frequent urinary tract infections   . History of gonorrhea   . History of trichomoniasis   . Hypertension   . Menorrhagia with irregular cycle 04/08/2015  . Migraines   . Pelvic pain in female 04/08/2015  . Rheumatoid arthritis(714.0)   . Smoker 04/08/2015  . Urinary frequency 04/08/2015  . Vaginal Pap smear, abnormal                               Surgical Hx:    Past Surgical History:  Procedure Laterality Date  . CESAREAN SECTION    . TUBAL LIGATION     Medications: Reviewed & Updated - see associated section                       Current Outpatient Prescriptions:  .  lisinopril-hydrochlorothiazide (PRINZIDE,ZESTORETIC) 20-25 MG tablet, Take 1 tablet by mouth daily as needed (blood pressure)., Disp: , Rfl:  .  oxyCODONE-acetaminophen (PERCOCET) 10-325 MG tablet, Take 1 tablet by mouth every 4 (four) hours as needed for pain., Disp: , Rfl:    Social History: Reviewed -  reports that she has been smoking  Cigarettes.  She has a 5.00 pack-year smoking history. She has never used smokeless tobacco.  Objective Findings:  Vitals: Blood pressure (!) 158/80, pulse 72, height 5\' 3"  (1.6 m), weight 196 lb (88.9 kg), last menstrual period 08/19/2015.  Physical Examination: General appearance - alert, well appearing, and in no distress and oriented to person, place, and time Mental status - alert, oriented to person, place, and time, normal mood, behavior, speech, dress, motor activity, and thought processes Pelvic - VULVA: normal appearing vulva with no masses, tenderness or lesions,  VAGINA: normal appearing vagina with normal color and discharge, no lesions, normal appearing whitish discharge, non-purulent.  CERVIX: normal appearing cervix without discharge or lesions,  Bimanual exam: deferred at this time    Assessment & Plan:   A:  1. Presence of clue cells without trich, normal epithelial  P:  1. Metronidazole for BV 2. Follow up GC chlamydia  3. Depo Provera for menstrual control rx escribed   until patient comes back for ablation.     By signing my name below, I, Sonum Patel, attest that this documentation has been prepared under the direction and in the presence of Tilda BurrowJohn V Katie Moch,  MD. Electronically Signed: Sonum Patel, Scribe. 09/09/15. 9:32 AM.   I personally performed the services described in this documentation, which was SCRIBED in my presence. The recorded information has been reviewed and considered accurate. It has been edited as necessary during review. Tilda BurrowFERGUSON,Fredrich Cory V, MD

## 2015-09-12 ENCOUNTER — Ambulatory Visit: Payer: BLUE CROSS/BLUE SHIELD

## 2015-09-12 LAB — GC/CHLAMYDIA PROBE AMP
Chlamydia trachomatis, NAA: NEGATIVE
Neisseria gonorrhoeae by PCR: NEGATIVE

## 2015-09-13 ENCOUNTER — Ambulatory Visit (INDEPENDENT_AMBULATORY_CARE_PROVIDER_SITE_OTHER): Payer: BLUE CROSS/BLUE SHIELD | Admitting: *Deleted

## 2015-09-13 ENCOUNTER — Telehealth: Payer: Self-pay | Admitting: *Deleted

## 2015-09-13 ENCOUNTER — Encounter: Payer: Self-pay | Admitting: *Deleted

## 2015-09-13 ENCOUNTER — Ambulatory Visit: Payer: BLUE CROSS/BLUE SHIELD

## 2015-09-13 DIAGNOSIS — Z3042 Encounter for surveillance of injectable contraceptive: Secondary | ICD-10-CM | POA: Diagnosis not present

## 2015-09-13 DIAGNOSIS — Z3202 Encounter for pregnancy test, result negative: Secondary | ICD-10-CM | POA: Diagnosis not present

## 2015-09-13 LAB — POCT URINE PREGNANCY: Preg Test, Ur: NEGATIVE

## 2015-09-13 MED ORDER — MEDROXYPROGESTERONE ACETATE 150 MG/ML IM SUSP
150.0000 mg | Freq: Once | INTRAMUSCULAR | Status: AC
  Administered 2015-09-13: 150 mg via INTRAMUSCULAR

## 2015-09-13 NOTE — Progress Notes (Signed)
Pt here for Depo. Pt tolerated shot well. Return in 12 weeks for next shot. JSY 

## 2015-09-13 NOTE — Telephone Encounter (Signed)
Pt called stating Depo was not at [facility]. Pt saw Dr. Emelda FearFerguson on 09/09/15. I gave a verbal to [facility], Depo Provera 150 mg inject 1 ml into the muscle every 3 months with 4 refills per Dr. Emelda FearFerguson. Pt aware. JSY

## 2015-12-09 ENCOUNTER — Ambulatory Visit (INDEPENDENT_AMBULATORY_CARE_PROVIDER_SITE_OTHER): Payer: BLUE CROSS/BLUE SHIELD | Admitting: *Deleted

## 2015-12-09 DIAGNOSIS — Z3202 Encounter for pregnancy test, result negative: Secondary | ICD-10-CM

## 2015-12-09 DIAGNOSIS — Z3042 Encounter for surveillance of injectable contraceptive: Secondary | ICD-10-CM

## 2015-12-09 LAB — POCT URINE PREGNANCY: PREG TEST UR: NEGATIVE

## 2015-12-09 MED ORDER — MEDROXYPROGESTERONE ACETATE 150 MG/ML IM SUSP
150.0000 mg | Freq: Once | INTRAMUSCULAR | Status: AC
  Administered 2015-12-09: 150 mg via INTRAMUSCULAR

## 2015-12-09 NOTE — Consult Note (Signed)
Pt here for Depo-Provera injection, pt reports some spotting with the Deop but no other problems.  Injection given in Rt Deltoid, pt tolerated well.  Pt instructed to F/U in 3 months for next injection.

## 2016-03-12 ENCOUNTER — Ambulatory Visit: Payer: BLUE CROSS/BLUE SHIELD

## 2016-03-12 ENCOUNTER — Encounter: Payer: Self-pay | Admitting: Obstetrics & Gynecology

## 2016-08-23 ENCOUNTER — Telehealth: Payer: Self-pay | Admitting: Orthopaedic Surgery

## 2016-08-23 ENCOUNTER — Ambulatory Visit (INDEPENDENT_AMBULATORY_CARE_PROVIDER_SITE_OTHER): Payer: BLUE CROSS/BLUE SHIELD | Admitting: Orthopaedic Surgery

## 2016-08-23 ENCOUNTER — Encounter: Payer: Self-pay | Admitting: Orthopaedic Surgery

## 2016-08-23 VITALS — BP 137/96 | HR 79 | Temp 98.4°F | Ht 64.0 in | Wt 191.0 lb

## 2016-08-23 DIAGNOSIS — F17210 Nicotine dependence, cigarettes, uncomplicated: Secondary | ICD-10-CM

## 2016-08-23 DIAGNOSIS — G5601 Carpal tunnel syndrome, right upper limb: Secondary | ICD-10-CM | POA: Diagnosis not present

## 2016-08-23 MED ORDER — PREDNISONE 5 MG (21) PO TBPK: ORAL_TABLET | ORAL | 0 refills | Status: DC

## 2016-08-23 NOTE — Patient Instructions (Signed)
Steps to Quit Smoking Smoking tobacco can be bad for your health. It can also affect almost every organ in your body. Smoking puts you and people around you at risk for many serious long-lasting (chronic) diseases. Quitting smoking is hard, but it is one of the best things that you can do for your health. It is never too late to quit. What are the benefits of quitting smoking? When you quit smoking, you lower your risk for getting serious diseases and conditions. They can include:  Lung cancer or lung disease.  Heart disease.  Stroke.  Heart attack.  Not being able to have children (infertility).  Weak bones (osteoporosis) and broken bones (fractures).  If you have coughing, wheezing, and shortness of breath, those symptoms may get better when you quit. You may also get sick less often. If you are pregnant, quitting smoking can help to lower your chances of having a baby of low birth weight. What can I do to help me quit smoking? Talk with your doctor about what can help you quit smoking. Some things you can do (strategies) include:  Quitting smoking totally, instead of slowly cutting back how much you smoke over a period of time.  Going to in-person counseling. You are more likely to quit if you go to many counseling sessions.  Using resources and support systems, such as: ? Online chats with a counselor. ? Phone quitlines. ? Printed self-help materials. ? Support groups or group counseling. ? Text messaging programs. ? Mobile phone apps or applications.  Taking medicines. Some of these medicines may have nicotine in them. If you are pregnant or breastfeeding, do not take any medicines to quit smoking unless your doctor says it is okay. Talk with your doctor about counseling or other things that can help you.  Talk with your doctor about using more than one strategy at the same time, such as taking medicines while you are also going to in-person counseling. This can help make  quitting easier. What things can I do to make it easier to quit? Quitting smoking might feel very hard at first, but there is a lot that you can do to make it easier. Take these steps:  Talk to your family and friends. Ask them to support and encourage you.  Call phone quitlines, reach out to support groups, or work with a counselor.  Ask people who smoke to not smoke around you.  Avoid places that make you want (trigger) to smoke, such as: ? Bars. ? Parties. ? Smoke-break areas at work.  Spend time with people who do not smoke.  Lower the stress in your life. Stress can make you want to smoke. Try these things to help your stress: ? Getting regular exercise. ? Deep-breathing exercises. ? Yoga. ? Meditating. ? Doing a body scan. To do this, close your eyes, focus on one area of your body at a time from head to toe, and notice which parts of your body are tense. Try to relax the muscles in those areas.  Download or buy apps on your mobile phone or tablet that can help you stick to your quit plan. There are many free apps, such as QuitGuide from the CDC (Centers for Disease Control and Prevention). You can find more support from smokefree.gov and other websites.  This information is not intended to replace advice given to you by your health care provider. Make sure you discuss any questions you have with your health care provider. Document Released: 11/04/2008 Document   Revised: 09/06/2015 Document Reviewed: 05/25/2014 Elsevier Interactive Patient Education  2018 Elsevier Inc.  

## 2016-08-23 NOTE — Telephone Encounter (Signed)
Request for medical record information (Nerve conduction study) faxed to [facility], Dr Cindee SaltGary Kuzma, per authorization/request form signed by patient, for purpose of continuity of care.

## 2016-08-23 NOTE — Progress Notes (Signed)
   Subjective:    Patient ID: [name], female    DOB: 03-31-1980, 36 y.o.   MRN: [id number]  HPI She has had right hand numbness and pain for most of this year.  She has more nocturnal pain and numbness in the median nerve distribution.  She was seen by Dr. Merlyn LotKuzma in March and had nerve conduction studies showing carpal tunnel syndrome on the right.  He talked to her about her condition.  At that time she had good results from a prednisone dose and a splint.  He told her to come back when her pain got worse and he could do the elective surgery on the wrist.  She got worse over the last two months.  She has more pain, pain during the day and pain going up to the shoulder.  She saw Dr. Charm BargesButler who gave her Neurontin but that just made the patient sleepy.  She is here today for her increased pain and no relief.  The splint, medicine, ice, heat, elevation does not help at all.   She has no history of trauma.  She smokes and is willing to stop.   Review of Systems  HENT: Negative for congestion.   Respiratory: Negative for cough and shortness of breath.   Cardiovascular: Negative for chest pain and leg swelling.  Endocrine: Negative for cold intolerance.  Musculoskeletal: Positive for arthralgias.  Allergic/Immunologic: Negative for environmental allergies.       Objective:   Physical Exam  Constitutional: She is oriented to person, place, and time. She appears well-developed and well-nourished.  HENT:  Head: Normocephalic and atraumatic.  Eyes: Pupils are equal, round, and reactive to light. Conjunctivae and EOM are normal.  Neck: Normal range of motion. Neck supple.  Cardiovascular: Normal rate, regular rhythm and intact distal pulses.   Pulmonary/Chest: Effort normal.  Abdominal: Soft.  Musculoskeletal: She exhibits tenderness (Right hand tender, positive Phalen and Tinels right, decreased sensation median nerve distribution, some early stiffness of fingers; left hand  negative.).  Neurological: She is alert and oriented to person, place, and time. She displays normal reflexes. No cranial nerve deficit. She exhibits normal muscle tone. Coordination normal.  Skin: Skin is warm and dry.  Psychiatric: She has a normal mood and affect. Her behavior is normal. Judgment and thought content normal.  Vitals reviewed.         Assessment & Plan:   Encounter Diagnoses  Name Primary?  . Carpal tunnel syndrome on right Yes  . Cigarette nicotine dependence without complication    I have advised her to get the surgery.  I will have Dr. Romeo AppleHarrison see her.  She understands about the nerve being "pinched."  Call if any problem.  Precautions discussed.   Electronically Signed Darreld McleanWayne Elysabeth Aust, MD 8/2/20189:05 AM

## 2016-09-14 ENCOUNTER — Emergency Department (HOSPITAL_COMMUNITY)
Admission: EM | Admit: 2016-09-14 | Discharge: 2016-09-14 | Disposition: A | Discharge status: Home / Self Care | Payer: BLUE CROSS/BLUE SHIELD | Attending: Emergency Medicine | Admitting: Emergency Medicine

## 2016-09-14 DIAGNOSIS — I10 Essential (primary) hypertension: Secondary | ICD-10-CM | POA: Diagnosis not present

## 2016-09-14 DIAGNOSIS — F17210 Nicotine dependence, cigarettes, uncomplicated: Secondary | ICD-10-CM | POA: Diagnosis not present

## 2016-09-14 DIAGNOSIS — R590 Localized enlarged lymph nodes: Secondary | ICD-10-CM | POA: Insufficient documentation

## 2016-09-14 DIAGNOSIS — J029 Acute pharyngitis, unspecified: Secondary | ICD-10-CM | POA: Insufficient documentation

## 2016-09-14 DIAGNOSIS — Z79899 Other long term (current) drug therapy: Secondary | ICD-10-CM | POA: Insufficient documentation

## 2016-09-14 LAB — RAPID STREP SCREEN (MED CTR MEBANE ONLY): STREPTOCOCCUS, GROUP A SCREEN (DIRECT): NEGATIVE

## 2016-09-14 MED ORDER — KETOROLAC TROMETHAMINE 30 MG/ML IJ SOLN
30.0000 mg | Freq: Once | INTRAMUSCULAR | Status: AC
  Administered 2016-09-14: 30 mg via INTRAMUSCULAR
  Filled 2016-09-14: qty 1

## 2016-09-14 MED ORDER — PENICILLIN G BENZATHINE & PROC 900000-300000 UNIT/2ML IM SUSP: 1.2000 10*6.[IU] | Freq: Once | INTRAMUSCULAR | Status: DC

## 2016-09-14 MED ORDER — PENICILLIN G BENZATHINE 600000 UNIT/ML IM SUSP: 1200000.0000 [IU] | Freq: Once | INTRAMUSCULAR | 0 refills | Status: DC

## 2016-09-14 MED ORDER — PENICILLIN G BENZATHINE 1200000 UNIT/2ML IM SUSP
1.2000 10*6.[IU] | Freq: Once | INTRAMUSCULAR | Status: AC
  Administered 2016-09-14: 1.2 10*6.[IU] via INTRAMUSCULAR
  Filled 2016-09-14: qty 2

## 2016-09-14 MED ORDER — DEXAMETHASONE SODIUM PHOSPHATE 10 MG/ML IJ SOLN
10.0000 mg | Freq: Once | INTRAMUSCULAR | Status: AC
  Administered 2016-09-14: 10 mg via INTRAMUSCULAR
  Filled 2016-09-14: qty 1

## 2016-09-14 NOTE — ED Provider Notes (Signed)
[facility]-EMERGENCY DEPT Provider Note   CSN: [id number] Arrival date & time: 09/14/16  1953     History   Chief Complaint Chief Complaint  Patient presents with  . Sore Throat    x 4 days    HPI [name] is a 36 y.o. female who presents today for 4 days of sore throat. She reports that she has been gradually getting worse since this started. She is able to swallow, however reports that it is painful to do so.  She denies fevers, abdominal pain, recent sick contacts.   No neck stiffness, headache, visual changes, runny nose.  She does report that with in the past week she has had oral sex with a female partner.  She expressed concern that she may have an infection from that.   HPI  Past Medical History:  Diagnosis Date  . Abnormal pap 2009, 2010  . Dysmenorrhea 04/08/2015  . Fibromyalgia   . History of chlamydia   . History of frequent urinary tract infections   . History of gonorrhea   . History of trichomoniasis   . Hypertension   . Menorrhagia with irregular cycle 04/08/2015  . Migraines   . Pelvic pain in female 04/08/2015  . Rheumatoid arthritis(714.0)   . Smoker 04/08/2015  . Urinary frequency 04/08/2015  . Vaginal Pap smear, abnormal     Patient Active Problem List   Diagnosis Date Noted  . BV (bacterial vaginosis) 09/09/2015  . Smoker 04/08/2015  . Urinary frequency 04/08/2015  . Pelvic pain in female 04/08/2015  . Menorrhagia with irregular cycle 04/08/2015  . Dysmenorrhea 04/08/2015  . H N P-LUMBAR 02/15/2009  . DISC DEGENERATION 12/05/2006  . SHOULDER PAIN 10/21/2006  . BACK PAIN 10/21/2006  . IMPINGEMENT SYNDROME 10/21/2006    Past Surgical History:  Procedure Laterality Date  . CESAREAN SECTION    . TUBAL LIGATION      OB History    Gravida Para Term Preterm AB Living   2 2           SAB TAB Ectopic Multiple Live Births                   Home Medications    Prior to Admission medications   Medication Sig Start Date End Date Taking?  Authorizing Provider  citalopram (CELEXA) 10 MG tablet Take 10 mg by mouth daily.    [provider]  lisinopril-hydrochlorothiazide (PRINZIDE,ZESTORETIC) 20-25 MG tablet Take 1 tablet by mouth daily as needed (blood pressure).    [provider]  medroxyPROGESTERone (DEPO-PROVERA) 150 MG/ML injection Inject 150 mg into the muscle every 3 (three) months.    [provider]  metroNIDAZOLE (FLAGYL) 500 MG tablet Take 1 tablet (500 mg total) by mouth 2 (two) times daily. Patient not taking: Reported on 12/09/2015 09/09/15   Tilda Burrow, MD  oxyCODONE-acetaminophen (PERCOCET) 10-325 MG tablet Take 1 tablet by mouth every 4 (four) hours as needed for pain.    [provider]  predniSONE (STERAPRED UNI-PAK 21 TAB) 5 MG (21) TBPK tablet Take 6 pills first day; 5 pills second day; 4 pills third day; 3 pills fourth day; 2 pills next day and 1 pill last day. 08/23/16   Darreld Mclean, MD    Family History Family History  Problem Relation Age of Onset  . Cancer Mother        cervical  . Cancer Maternal Aunt        breast   . Hypertension  Maternal Aunt   . Cancer Maternal Grandmother        colon and uterine  . Cancer Maternal Aunt        colon  . Migraines Father   . Cancer Maternal Grandfather   . Cancer Paternal Grandmother   . Cancer Paternal Grandfather   . Gallstones Sister     Social History Social History  Substance Use Topics  . Smoking status: Current Every Day Smoker    Packs/day: 0.25    Years: 20.00    Types: Cigarettes  . Smokeless tobacco: Never Used     Comment: smokes 5 cig daily  . Alcohol use Yes     Comment: OCC     Allergies   Patient has no known allergies.   Review of Systems Review of Systems  Constitutional: Negative for chills and fatigue.  HENT: Positive for postnasal drip and sore throat. Negative for congestion, dental problem, drooling, facial swelling, rhinorrhea, sinus pain, sinus pressure and voice change.     Respiratory: Negative for cough and shortness of breath.   Gastrointestinal: Negative for nausea and vomiting.  Neurological: Negative for light-headedness and headaches.     Physical Exam Updated Vital Signs BP (!) 154/89 (BP Location: Right Arm)   Pulse 79   Temp 97.7 F (36.5 C) (Tympanic)   Ht 5\' 4"  (1.626 m)   Wt 89.8 kg (198 lb)   LMP 08/31/2016   SpO2 99%   BMI 33.99 kg/m   Physical Exam  Constitutional: She appears well-developed and well-nourished.  HENT:  Head: Normocephalic and atraumatic.  Right Ear: Tympanic membrane, external ear and ear canal normal. No hemotympanum.  Left Ear: Tympanic membrane, external ear and ear canal normal. No hemotympanum.  Nose: Nose normal.  Mouth/Throat: Uvula is midline, oropharynx is clear and moist and mucous membranes are normal. No trismus in the jaw. No uvula swelling. No posterior oropharyngeal edema, posterior oropharyngeal erythema or tonsillar abscesses. Tonsils are 3+ on the right. Tonsils are 3+ on the left. Tonsillar exudate.  Eyes: Conjunctivae are normal.  Neck: Normal range of motion. Neck supple. No JVD present. No tracheal deviation present.  Pulmonary/Chest: Effort normal. No stridor. No respiratory distress.  Lymphadenopathy:    She has cervical adenopathy.  Neurological: She is alert.  Skin: Skin is warm and dry. No rash noted. She is not diaphoretic.  Psychiatric: She has a normal mood and affect. Her behavior is normal.  Nursing note and vitals reviewed.    ED Treatments / Results  Labs (all labs ordered are listed, but only abnormal results are displayed) Labs Reviewed  RAPID STREP SCREEN (NOT AT [facility])  CULTURE, GROUP A STREP ([facility])  GC/CHLAMYDIA PROBE AMP ([place]) NOT AT [facility]    EKG  EKG Interpretation None       Radiology No results found.  Procedures Procedures (including critical care time)  Medications Ordered in ED Medications  dexamethasone (DECADRON) injection 10 mg (10 mg  Intramuscular Given 09/14/16 2132)  ketorolac (TORADOL) 30 MG/ML injection 30 mg (30 mg Intramuscular Given 09/14/16 2131)  penicillin g benzathine (BICILLIN LA) 1200000 UNIT/2ML injection 1.2 Million Units (1.2 Million Units Intramuscular Given 09/14/16 2131)     Initial Impression / Assessment and Plan / ED Course  I have reviewed the triage vital signs and the nursing notes.  Pertinent labs & imaging results that were available during my care of the patient were reviewed by me and considered in my medical decision making (see chart for details).  Pt afebrile with tonsillar exudate, cervical lymphadenopathy, & dysphagia; diagnosis of strep. Treated in the Ed with steroids, Pain medication, and PCN IM.  Pt appears mildly dehydrated, discussed importance of water rehydration. Presentation non concerning for PTA or infxn spread to soft tissue. Based on reported oral sex recently will add on throat swab for gonorrhea and chlamydia.  Patient was In the option to be treated for these however she declined at this time.  She is aware that she has these cultures pending and that they will take at least 48 hours to result, and that if they are positive she will need to get treatment.   No trismus or uvula deviation. Specific return precautions discussed. Pt able to drink water in ED without difficulty with intact air way. Recommended PCP follow up.    Final Clinical Impressions(s) / ED Diagnoses   Final diagnoses:  Sore throat    New Prescriptions Discharge Medication List as of 09/14/2016  9:23 PM       Cristina Gong, PA-C 09/14/16 2347    Samuel Jester, DO 09/15/16 2109

## 2016-09-14 NOTE — Discharge Instructions (Signed)
While in the ED your blood pressure was high.  Please follow up with your primary care doctor or the wellness clinic for repeat evaluation as you may need medication.  High blood pressure can cause long term, potentially serious, damage if left untreated.   Please take Ibuprofen (Advil, motrin) and Tylenol (acetaminophen) to relieve your pain.  You may take up to 600 MG (3 pills) of normal strength ibuprofen every 8 hours as needed.  In between doses of ibuprofen you make take tylenol, up to 1,000 mg (two extra strength pills).  Do not take more than 3,000 mg tylenol in a 24 hour period.  Please check all medication labels as many medications such as pain and cold medications may contain tylenol.  Do not drink alcohol while taking these medications.  Do not take other NSAID'S while taking ibuprofen (such as aleve or naproxen).  Please take ibuprofen with food to decrease stomach upset.  Please make sure that you stay well hydrated.

## 2016-09-14 NOTE — ED Triage Notes (Signed)
Sore throat x 4 days  Dr Charm Barges is PCP

## 2016-09-17 LAB — CULTURE, GROUP A STREP (THRC)

## 2016-09-17 LAB — GC/CHLAMYDIA PROBE AMP (~~LOC~~) NOT AT ARMC
CHLAMYDIA, DNA PROBE: NEGATIVE
Neisseria Gonorrhea: NEGATIVE

## 2016-10-24 ENCOUNTER — Other Ambulatory Visit: Payer: BLUE CROSS/BLUE SHIELD | Admitting: Adult Health

## 2016-11-14 ENCOUNTER — Other Ambulatory Visit: Payer: BLUE CROSS/BLUE SHIELD | Admitting: Adult Health

## 2016-11-16 ENCOUNTER — Other Ambulatory Visit: Payer: BLUE CROSS/BLUE SHIELD | Admitting: Adult Health

## 2016-12-21 ENCOUNTER — Other Ambulatory Visit: Payer: Medicaid Other | Admitting: Adult Health

## 2016-12-28 ENCOUNTER — Other Ambulatory Visit: Payer: Medicaid Other | Admitting: Adult Health

## 2017-02-08 ENCOUNTER — Other Ambulatory Visit: Payer: Medicaid Other | Admitting: Adult Health

## 2017-02-14 ENCOUNTER — Ambulatory Visit: Payer: BLUE CROSS/BLUE SHIELD | Admitting: Orthopaedic Surgery

## 2017-02-14 ENCOUNTER — Telehealth: Payer: Self-pay | Admitting: Orthopaedic Surgery

## 2017-02-14 ENCOUNTER — Encounter: Payer: Self-pay | Admitting: Orthopaedic Surgery

## 2017-02-14 NOTE — Telephone Encounter (Signed)
Patient was a no show.  I called pt but got no answer.  I have sent letter asking pt to call and reschedule appointment.

## 2017-03-08 ENCOUNTER — Encounter: Payer: Self-pay | Admitting: Adult Health

## 2017-03-08 ENCOUNTER — Ambulatory Visit (INDEPENDENT_AMBULATORY_CARE_PROVIDER_SITE_OTHER): Payer: Medicaid Other | Admitting: Adult Health

## 2017-03-08 VITALS — BP 140/82 | HR 70 | Ht 63.0 in | Wt 185.0 lb

## 2017-03-08 DIAGNOSIS — Z0001 Encounter for general adult medical examination with abnormal findings: Secondary | ICD-10-CM | POA: Diagnosis not present

## 2017-03-08 DIAGNOSIS — R102 Pelvic and perineal pain: Secondary | ICD-10-CM

## 2017-03-08 DIAGNOSIS — N921 Excessive and frequent menstruation with irregular cycle: Secondary | ICD-10-CM | POA: Diagnosis not present

## 2017-03-08 DIAGNOSIS — Z113 Encounter for screening for infections with a predominantly sexual mode of transmission: Secondary | ICD-10-CM | POA: Diagnosis not present

## 2017-03-08 DIAGNOSIS — F17200 Nicotine dependence, unspecified, uncomplicated: Secondary | ICD-10-CM | POA: Diagnosis not present

## 2017-03-08 DIAGNOSIS — Z01419 Encounter for gynecological examination (general) (routine) without abnormal findings: Secondary | ICD-10-CM | POA: Insufficient documentation

## 2017-03-08 NOTE — Patient Instructions (Signed)
Endometrial Ablation Endometrial ablation is a procedure that destroys the thin inner layer of the lining of the uterus (endometrium). This procedure may be done:  To stop heavy periods.  To stop bleeding that is causing anemia.  To control irregular bleeding.  To treat bleeding caused by small tumors (fibroids) in the endometrium.  This procedure is often an alternative to major surgery, such as removal of the uterus and cervix (hysterectomy). As a result of this procedure:  You may not be able to have children. However, if you are premenopausal (you have not gone through menopause): ? You may still have a small chance of getting pregnant. ? You will need to use a reliable method of birth control after the procedure to prevent pregnancy.  You may stop having a menstrual period, or you may have only a small amount of bleeding during your period. Menstruation may return several years after the procedure.  Tell a health care provider about:  Any allergies you have.  All medicines you are taking, including vitamins, herbs, eye drops, creams, and over-the-counter medicines.  Any problems you or family members have had with the use of anesthetic medicines.  Any blood disorders you have.  Any surgeries you have had.  Any medical conditions you have. What are the risks? Generally, this is a safe procedure. However, problems may occur, including:  A hole (perforation) in the uterus or bowel.  Infection of the uterus, bladder, or vagina.  Bleeding.  Damage to other structures or organs.  An air bubble in the lung (air embolus).  Problems with pregnancy after the procedure.  Failure of the procedure.  Decreased ability to diagnose cancer in the endometrium.  What happens before the procedure?  You will have tests of your endometrium to make sure there are no pre-cancerous cells or cancer cells present.  You may have an ultrasound of the uterus.  You may be given  medicines to thin the endometrium.  Ask your health care provider about: ? Changing or stopping your regular medicines. This is especially important if you take diabetes medicines or blood thinners. ? Taking medicines such as aspirin and ibuprofen. These medicines can thin your blood. Do not take these medicines before your procedure if your doctor tells you not to.  Plan to have someone take you home from the hospital or clinic. What happens during the procedure?  You will lie on an exam table with your feet and legs supported as in a pelvic exam.  To lower your risk of infection: ? Your health care team will wash or sanitize their hands and put on germ-free (sterile) gloves. ? Your genital area will be washed with soap.  An IV tube will be inserted into one of your veins.  You will be given a medicine to help you relax (sedative).  A surgical instrument with a light and camera (resectoscope) will be inserted into your vagina and moved into your uterus. This allows your surgeon to see inside your uterus.  Endometrial tissue will be removed using one of the following methods: ? Radiofrequency. This method uses a radiofrequency-alternating electric current to remove the endometrium. ? Cryotherapy. This method uses extreme cold to freeze the endometrium. ? Heated-free liquid. This method uses a heated saltwater (saline) solution to remove the endometrium. ? Microwave. This method uses high-energy microwaves to heat up the endometrium and remove it. ? Thermal balloon. This method involves inserting a catheter with a balloon tip into the uterus. The balloon tip is   filled with heated fluid to remove the endometrium. The procedure may vary among health care providers and hospitals. What happens after the procedure?  Your blood pressure, heart rate, breathing rate, and blood oxygen level will be monitored until the medicines you were given have worn off.  As tissue healing occurs, you may  notice vaginal bleeding for 4-6 weeks after the procedure. You may also experience: ? Cramps. ? Thin, watery vaginal discharge that is light pink or brown in color. ? A need to urinate more frequently than usual. ? Nausea.  Do not drive for 24 hours if you were given a sedative.  Do not have sex or insert anything into your vagina until your health care provider approves. Summary  Endometrial ablation is done to treat the many causes of heavy menstrual bleeding.  The procedure may be done only after medications have been tried to control the bleeding.  Plan to have someone take you home from the hospital or clinic. This information is not intended to replace advice given to you by your health care provider. Make sure you discuss any questions you have with your health care provider. Document Released: 11/18/2003 Document Revised: 01/26/2016 Document Reviewed: 01/26/2016 Elsevier Interactive Patient Education  2017 [employer] Intrauterine Device Information An intrauterine device (IUD) is inserted into your uterus to prevent pregnancy. There are two types of IUDs available:  Copper IUD-This type of IUD is wrapped in copper wire and is placed inside the uterus. Copper makes the uterus and fallopian tubes produce a fluid that kills sperm. The copper IUD can stay in place for 10 years.  Hormone IUD-This type of IUD contains the hormone progestin (synthetic progesterone). The hormone thickens the cervical mucus and prevents sperm from entering the uterus. It also thins the uterine lining to prevent implantation of a fertilized egg. The hormone can weaken or kill the sperm that get into the uterus. One type of hormone IUD can stay in place for 5 years, and another type can stay in place for 3 years.  Your health care provider will make sure you are a good candidate for a contraceptive IUD. Discuss with your health care provider the possible side effects. Advantages of an intrauterine  device  IUDs are highly effective, reversible, long acting, and low maintenance.  There are no estrogen-related side effects.  An IUD can be used when breastfeeding.  IUDs are not associated with weight gain.  The copper IUD works immediately after insertion.  The hormone IUD works right away if inserted within 7 days of your period starting. You will need to use a backup method of birth control for 7 days if the hormone IUD is inserted at any other time in your cycle.  The copper IUD does not interfere with your female hormones.  The hormone IUD can make heavy menstrual periods lighter and decrease cramping.  The hormone IUD can be used for 3 or 5 years.  The copper IUD can be used for 10 years. Disadvantages of an intrauterine device  The hormone IUD can be associated with irregular bleeding patterns.  The copper IUD can make your menstrual flow heavier and more painful.  You may experience cramping and vaginal bleeding after insertion. This information is not intended to replace advice given to you by your health care provider. Make sure you discuss any questions you have with your health care provider. Document Released: 12/13/2003 Document Revised: 06/16/2015 Document Reviewed: 06/29/2012 Elsevier Interactive Patient Education  2017 [employer] Inc. Menorrhagia Menorrhagia  is a condition in which menstrual periods are heavy or last longer than normal. With menorrhagia, most periods a woman has may cause enough blood loss and cramping that she becomes unable to take part in her usual activities. What are the causes? Common causes of this condition include:  Noncancerous growths in the uterus (polyps or fibroids).  An imbalance of the estrogen and progesterone hormones.  One of the ovaries not releasing an egg during one or more months.  A problem with the thyroid gland (hypothyroid).  Side effects of having an intrauterine device (IUD).  Side effects of some medicines,  such as anti-inflammatory medicines or blood thinners.  A bleeding disorder that stops the blood from clotting normally.  In some cases, the cause of this condition is not known. What are the signs or symptoms? Symptoms of this condition include:  Routinely having to change your pad or tampon every 1-2 hours because it is completely soaked.  Needing to use pads and tampons at the same time because of heavy bleeding.  Needing to wake up to change your pads or tampons during the night.  Passing blood clots larger than 1 inch (2.5 cm) in size.  Having bleeding that lasts for more than 7 days.  Having symptoms of low iron levels (anemia), such as tiredness, fatigue, or shortness of breath.  How is this diagnosed? This condition may be diagnosed based on:  A physical exam.  Your symptoms and menstrual history.  Tests, such as: ? Blood tests to check if you are pregnant or have hormonal changes, a bleeding or thyroid disorder, anemia, or other problems. ? Pap test to check for cancerous changes, infections, or inflammation. ? Endometrial biopsy. This test involves removing a tissue sample from the lining of the uterus (endometrium) to be examined under a microscope. ? Pelvic ultrasound. This test uses sound waves to create images of your uterus, ovaries, and vagina. The images can show if you have fibroids or other growths. ? Hysteroscopy. For this test, a small telescope is used to look inside your uterus.  How is this treated? Treatment may not be needed for this condition. If it is needed, the best treatment for you will depend on:  Whether you need to prevent pregnancy.  Your desire to have children in the future.  The cause and severity of your bleeding.  Your personal preference.  Medicines are the first step in treatment. You may be treated with:  Hormonal birth control methods. These treatments reduce bleeding during your menstrual period. They include: ? Birth  control pills. ? Skin patch. ? Vaginal ring. ? Shots (injections) that you get every 3 months. ? Hormonal IUD (intrauterine device). ? Implants that go under the skin.  Medicines that thicken blood and slow bleeding.  Medicines that reduce swelling, such as ibuprofen.  Medicines that contain an artificial (synthetic) hormone called progestin.  Medicines that make the ovaries stop working for a short time.  Iron supplements to treat anemia.  If medicines do not work, surgery may be done. Surgical options may include:  Dilation and curettage (D&C). In this procedure, your health care provider opens (dilates) your cervix and then scrapes or suctions tissue from the endometrium to reduce menstrual bleeding.  Operative hysteroscopy. In this procedure, a small tube with a light on the end (hysteroscope) is used to view your uterus and help remove polyps that may be causing heavy periods.  Endometrial ablation. This is when various techniques are used to permanently destroy  your entire endometrium. After endometrial ablation, most women have little or no menstrual flow. This procedure reduces your ability to become pregnant.  Endometrial resection. In this procedure, an electrosurgical wire loop is used to remove the endometrium. This procedure reduces your ability to become pregnant.  Hysterectomy. This is surgical removal of the uterus. This is a permanent procedure that stops menstrual periods. Pregnancy is not possible after a hysterectomy.  Follow these instructions at home: Medicines  Take over-the-counter and prescription medicines exactly as told by your health care provider. This includes iron pills.  Do not change or switch medicines without asking your health care provider.  Do not take aspirin or medicines that contain aspirin 1 week before or during your menstrual period. Aspirin may make bleeding worse. General instructions  If you need to change your sanitary pad or  tampon more than once every 2 hours, limit your activity until the bleeding stops.  Iron pills can cause constipation. To prevent or treat constipation while you are taking prescription iron supplements, your health care provider may recommend that you: ? Drink enough fluid to keep your urine clear or pale yellow. ? Take over-the-counter or prescription medicines. ? Eat foods that are high in fiber, such as fresh fruits and vegetables, whole grains, and beans. ? Limit foods that are high in fat and processed sugars, such as fried and sweet foods.  Eat well-balanced meals, including foods that are high in iron. Foods that have a lot of iron include leafy green vegetables, meat, liver, eggs, and whole grain breads and cereals.  Do not try to lose weight until the abnormal bleeding has stopped and your blood iron level is back to normal. If you need to lose weight, work with your health care provider to lose weight safely.  Keep all follow-up visits as told by your health care provider. This is important. Contact a health care provider if:  You soak through a pad or tampon every 1 or 2 hours, and this happens every time you have a period.  You need to use pads and tampons at the same time because you are bleeding so much.  You have nausea, vomiting, diarrhea, or other problems related to medicines you are taking. Get help right away if:  You soak through more than a pad or tampon in 1 hour.  You pass clots bigger than 1 inch (2.5 cm) wide.  You feel short of breath.  You feel like your heart is beating too fast.  You feel dizzy or faint.  You feel very weak or tired. Summary  Menorrhagia is a condition in which menstrual periods are heavy or last longer than normal.  Treatment will depend on the cause of the condition and may include medicines or procedures.  Take over-the-counter and prescription medicines exactly as told by your health care provider. This includes iron  pills.  Get help right away if you have heavy bleeding that soaks through more than a pad or tampon in 1 hour, you are passing large clots, or you feel dizzy, faint or short of breath. This information is not intended to replace advice given to you by your health care provider. Make sure you discuss any questions you have with your health care provider. Document Released: 01/08/2005 Document Revised: 01/02/2016 Document Reviewed: 01/02/2016 Elsevier Interactive Patient Education  [employer]2018 [employer] Inc[employer]

## 2017-03-08 NOTE — Progress Notes (Signed)
Patient ID: [name], female   DOB: 1980/02/24, 37 y.o.   MRN: [id number] History of Present Illness: Diane FellingCarla is a 37 year old black female in for a well woman gyn exam,she had normal pap with negative HPV in 2017.She is complaining of pelvic pain at times  and long periods. Periods last 7-10 days and can be heavy 5-7 days with clots, and changes pad every 30 minutes. PCP is Dr Charm BargesButler.   Current Medications, Allergies, Past Medical History, Past Surgical History, Family History and Social History were reviewed in [facility] electronic medical record.     Review of Systems: Patient denies any headaches, hearing loss, fatigue, blurred vision, shortness of breath, chest pain, abdominal pain, problems with bowel movements, urination, or intercourse. No joint pain or mood swings.See HPI for positives.    Physical Exam:BP 140/82 (BP Location: Left Arm, Patient Position: Sitting, Cuff Size: Normal)   Pulse 70   Ht 5\' 3"  (1.6 m)   Wt 185 lb (83.9 kg)   LMP 02/19/2017   BMI 32.77 kg/m  General:  Well developed, well nourished, no acute distress Skin:  Warm and dry Neck:  Midline trachea, normal thyroid, good ROM, no lymphadenopathy Lungs; Clear to auscultation bilaterally Breast:  No dominant palpable mass, retraction, or nipple discharge Cardiovascular: Regular rate and rhythm Abdomen:  Soft, non tender, no hepatosplenomegaly Pelvic:  External genitalia is normal in appearance, no lesions.  The vagina is normal in appearance. Urethra has no lesions or masses. The cervix is bulbous. GC/CHL obtained. Uterus is felt to be normal size, shape, and contour.  No adnexal masses,mild LLQ tenderness noted.Bladder is non tender, no masses felt. Extremities/musculoskeletal:  No swelling or varicosities noted, no clubbing or cyanosis Psych:  No mood changes, alert and cooperative,seems happy PHQ 2 score 0.  Impression: 1. Encounter for well woman exam with routine gynecological exam   2.  Pelvic pain in female   3. Menorrhagia with irregular cycle   4. Smoker   5. Screening examination for STD (sexually transmitted disease)       Plan: GC/CHL obtained and sent Check CBC,CMP,TSH and HIV and RPR Return in 1 week for GYN US Pap and physical in 1 year Mammogram at 39 Review handouts on ablation. IUD and menorrhagia

## 2017-03-09 LAB — CBC
Hematocrit: 35 % (ref 34.0–46.6)
Hemoglobin: 11.5 g/dL (ref 11.1–15.9)
MCH: 31.3 pg (ref 26.6–33.0)
MCHC: 32.9 g/dL (ref 31.5–35.7)
MCV: 95 fL (ref 79–97)
PLATELETS: 233 10*3/uL (ref 150–379)
RBC: 3.67 x10E6/uL — ABNORMAL LOW (ref 3.77–5.28)
RDW: 14.3 % (ref 12.3–15.4)
WBC: 5.7 10*3/uL (ref 3.4–10.8)

## 2017-03-09 LAB — HIV ANTIBODY (ROUTINE TESTING W REFLEX): HIV SCREEN 4TH GENERATION: NONREACTIVE

## 2017-03-09 LAB — COMPREHENSIVE METABOLIC PANEL
ALK PHOS: 72 IU/L (ref 39–117)
ALT: 11 IU/L (ref 0–32)
AST: 11 IU/L (ref 0–40)
Albumin/Globulin Ratio: 1.6 (ref 1.2–2.2)
Albumin: 4.2 g/dL (ref 3.5–5.5)
BUN/Creatinine Ratio: 9 (ref 9–23)
BUN: 7 mg/dL (ref 6–20)
Bilirubin Total: 0.3 mg/dL (ref 0.0–1.2)
CO2: 22 mmol/L (ref 20–29)
CREATININE: 0.74 mg/dL (ref 0.57–1.00)
Calcium: 9.1 mg/dL (ref 8.7–10.2)
Chloride: 105 mmol/L (ref 96–106)
GFR calc Af Amer: 120 mL/min/{1.73_m2} (ref >59)
GFR calc non Af Amer: 104 mL/min/{1.73_m2} (ref >59)
GLOBULIN, TOTAL: 2.6 g/dL (ref 1.5–4.5)
GLUCOSE: 99 mg/dL (ref 65–99)
Potassium: 4 mmol/L (ref 3.5–5.2)
SODIUM: 141 mmol/L (ref 134–144)
Total Protein: 6.8 g/dL (ref 6.0–8.5)

## 2017-03-09 LAB — RPR: RPR Ser Ql: NONREACTIVE

## 2017-03-09 LAB — TSH: TSH: 0.89 u[IU]/mL (ref 0.450–4.500)

## 2017-03-10 LAB — GC/CHLAMYDIA PROBE AMP
CHLAMYDIA, DNA PROBE: NEGATIVE
Neisseria gonorrhoeae by PCR: NEGATIVE

## 2017-03-15 ENCOUNTER — Other Ambulatory Visit: Payer: Medicaid Other

## 2017-03-18 ENCOUNTER — Other Ambulatory Visit: Payer: Medicaid Other

## 2017-03-29 ENCOUNTER — Other Ambulatory Visit: Payer: Medicaid Other

## 2017-09-04 ENCOUNTER — Ambulatory Visit: Payer: Self-pay | Admitting: Advanced Practice Midwife

## 2017-11-29 LAB — GLUCOSE, POCT (MANUAL RESULT ENTRY): POC GLUCOSE: 105 mg/dL — AB (ref 70–99)

## 2017-12-24 ENCOUNTER — Ambulatory Visit: Payer: Self-pay | Admitting: Advanced Practice Midwife

## 2017-12-24 ENCOUNTER — Ambulatory Visit: Payer: Self-pay

## 2018-03-20 ENCOUNTER — Ambulatory Visit (HOSPITAL_COMMUNITY)
Admission: EM | Admit: 2018-03-20 | Discharge: 2018-03-20 | Disposition: A | Discharge status: Home / Self Care | Payer: Medicaid Other | Attending: Family Medicine | Admitting: Family Medicine

## 2018-03-20 ENCOUNTER — Encounter (HOSPITAL_COMMUNITY): Payer: Self-pay

## 2018-03-20 DIAGNOSIS — J22 Unspecified acute lower respiratory infection: Secondary | ICD-10-CM

## 2018-03-20 DIAGNOSIS — R0981 Nasal congestion: Secondary | ICD-10-CM

## 2018-03-20 MED ORDER — AMOXICILLIN 500 MG PO CAPS: 500.0000 mg | ORAL_CAPSULE | Freq: Three times a day (TID) | ORAL | 0 refills | Status: DC

## 2018-03-20 MED ORDER — BENZONATATE 200 MG PO CAPS: 200.0000 mg | ORAL_CAPSULE | Freq: Two times a day (BID) | ORAL | 0 refills | Status: DC | PRN

## 2018-03-20 NOTE — Discharge Instructions (Addendum)
Rest  Drink plenty of fluids I have printed discount cards for the [facility] (cheapest) Take amoxicillin 3 times a day Take Tessalon 2 times a day for cough Return if not able to return to work by Monday

## 2018-03-20 NOTE — ED Triage Notes (Signed)
Pt c\o nasal congestion with some coughing and body aches.  Pt symptoms started last Friday.  Pt has tried OTC medication with no relief.

## 2018-03-20 NOTE — ED Provider Notes (Signed)
[facility]    CSN: [id number] Arrival date & time: 03/20/18  1150     History   Chief Complaint Chief Complaint  Patient presents with  . Generalized Body Aches  . Cough  . Nasal Congestion    HPI [name] is a 38 y.o. female.   HPI  Patient is here for respiratory infection.  She states she has had some coughing, body aches, nasal congestion since last week.  She states that the head cold is getting better but the "chest cold" is getting worse.  She is having more coughing, and more congested.  She feels tired.  Her chest feels tight.  She is coughing up some yellow sputum.  She is a cigarette smoker.  She is advised to quit.  Uncertain whether she has any underlying lung disease such as COPD or emphysema.  Old chest x-ray reviewed, this revealed a diagnosis of chronic bronchitis  Past Medical History:  Diagnosis Date  . Abnormal pap 2009, 2010  . Dysmenorrhea 04/08/2015  . Fibromyalgia   . History of chlamydia   . History of frequent urinary tract infections   . History of gonorrhea   . History of trichomoniasis   . Hypertension   . Menorrhagia with irregular cycle 04/08/2015  . Migraines   . Pelvic pain in female 04/08/2015  . Rheumatoid arthritis(714.0)   . Smoker 04/08/2015  . Urinary frequency 04/08/2015  . Vaginal Pap smear, abnormal     Patient Active Problem List   Diagnosis Date Noted  . Encounter for well woman exam with routine gynecological exam 03/08/2017  . BV (bacterial vaginosis) 09/09/2015  . Smoker 04/08/2015  . Urinary frequency 04/08/2015  . Pelvic pain in female 04/08/2015  . Menorrhagia with irregular cycle 04/08/2015  . Dysmenorrhea 04/08/2015  . H N P-LUMBAR 02/15/2009  . DISC DEGENERATION 12/05/2006  . SHOULDER PAIN 10/21/2006  . BACK PAIN 10/21/2006  . IMPINGEMENT SYNDROME 10/21/2006    Past Surgical History:  Procedure Laterality Date  . CESAREAN SECTION    . TUBAL LIGATION      OB History    Gravida  2   Para  2   Term      Preterm      AB      Living  2     SAB      TAB      Ectopic      Multiple      Live Births               Home Medications    Prior to Admission medications   Medication Sig Start Date End Date Taking? Authorizing Provider  amoxicillin (AMOXIL) 500 MG capsule Take 1 capsule (500 mg total) by mouth 3 (three) times daily. 03/20/18   Eustace Moore, MD  benzonatate (TESSALON) 200 MG capsule Take 1 capsule (200 mg total) by mouth 2 (two) times daily as needed for cough. 03/20/18   Eustace Moore, MD  lisinopril-hydrochlorothiazide (PRINZIDE,ZESTORETIC) 20-25 MG tablet Take 1 tablet by mouth daily as needed (blood pressure).    [provider]    Family History Family History  Problem Relation Age of Onset  . Cancer Mother        cervical  . Cancer Maternal Aunt        breast   . Hypertension Maternal Aunt   . Cancer Maternal Grandmother        colon and uterine  . Cancer Maternal Aunt  colon  . Migraines Father   . Cancer Maternal Grandfather   . Cancer Paternal Grandmother   . Cancer Paternal Grandfather   . Gallstones Sister     Social History Social History   Tobacco Use  . Smoking status: Current Every Day Smoker    Packs/day: 0.25    Years: 20.00    Pack years: 5.00    Types: Cigarettes  . Smokeless tobacco: Never Used  . Tobacco comment: smokes 5 cig daily  Substance Use Topics  . Alcohol use: Yes    Comment: OCC  . Drug use: No     Allergies   Patient has no known allergies.   Review of Systems Review of Systems  Constitutional: Positive for fatigue. Negative for chills and fever.  HENT: Positive for congestion and rhinorrhea. Negative for ear pain and sore throat.   Eyes: Negative for pain and visual disturbance.  Respiratory: Positive for cough. Negative for shortness of breath.   Cardiovascular: Negative for chest pain and palpitations.  Gastrointestinal: Negative for abdominal pain and  vomiting.  Genitourinary: Negative for dysuria and hematuria.  Musculoskeletal: Negative for arthralgias and back pain.  Skin: Negative for color change and rash.  Neurological: Negative for seizures and syncope.  All other systems reviewed and are negative.    Physical Exam Triage Vital Signs ED Triage Vitals  Enc Vitals Group     BP 03/20/18 1206 (!) 145/96     Pulse Rate 03/20/18 1206 83     Resp 03/20/18 1206 18     Temp 03/20/18 1206 97.9 F (36.6 C)     Temp Source 03/20/18 1206 Skin     SpO2 03/20/18 1206 100 %     Weight --      Height --      Head Circumference --      Peak Flow --      Pain Score 03/20/18 1207 6     Pain Loc --      Pain Edu? --      Excl. in GC? --    No data found.  Updated Vital Signs BP (!) 145/96 (BP Location: Left Arm)   Pulse 83   Temp 97.9 F (36.6 C) (Skin)   Resp 18   SpO2 100%       Physical Exam Constitutional:      General: She is not in acute distress.    Appearance: She is well-developed.  HENT:     Head: Normocephalic and atraumatic.     Right Ear: Tympanic membrane and ear canal normal.     Left Ear: Tympanic membrane and ear canal normal.     Nose: Nose normal. No congestion.     Mouth/Throat:     Mouth: Mucous membranes are moist.  Eyes:     Conjunctiva/sclera: Conjunctivae normal.     Pupils: Pupils are equal, round, and reactive to light.  Neck:     Musculoskeletal: Normal range of motion and neck supple.  Cardiovascular:     Rate and Rhythm: Normal rate and regular rhythm.     Heart sounds: Normal heart sounds.  Pulmonary:     Effort: Pulmonary effort is normal. No respiratory distress.     Breath sounds: Rhonchi present.  Abdominal:     General: There is no distension.     Palpations: Abdomen is soft.  Musculoskeletal: Normal range of motion.  Lymphadenopathy:     Cervical: No cervical adenopathy.  Skin:    General: Skin is  warm and dry.  Neurological:     Mental Status: She is alert.    Psychiatric:        Mood and Affect: Mood normal.        Behavior: Behavior normal.      UC Treatments / Results  Labs (all labs ordered are listed, but only abnormal results are displayed) Labs Reviewed - No data to display  EKG None  Radiology No results found.  Procedures Procedures (including critical care time)  Medications Ordered in UC Medications - No data to display  Initial Impression / Assessment and Plan / UC Course  I have reviewed the triage vital signs and the nursing notes.  Pertinent labs & imaging results that were available during my care of the patient were reviewed by me and considered in my medical decision making (see chart for details).     Added about given her underlying lung disease and her worsening symptoms.  She has been sick for only 7 to 8 days, but I do not believe this is all viral at this point. Final Clinical Impressions(s) / UC Diagnoses   Final diagnoses:  LRTI (lower respiratory tract infection)  Nasal congestion     Discharge Instructions     Rest  Drink plenty of fluids I have printed discount cards for the [employer] [facility] (cheapest) Take amoxicillin 3 times a day Take Tessalon 2 times a day for cough Return if not able to return to work by Monday   ED Prescriptions    Medication Sig Dispense Auth. Provider   benzonatate (TESSALON) 200 MG capsule Take 1 capsule (200 mg total) by mouth 2 (two) times daily as needed for cough. 20 capsule Eustace Moore, MD   amoxicillin (AMOXIL) 500 MG capsule Take 1 capsule (500 mg total) by mouth 3 (three) times daily. 30 capsule Eustace Moore, MD     Controlled Substance Prescriptions [place] Controlled Substance Registry consulted? Not Applicable   Eustace Moore, MD 03/20/18 [phone]

## 2018-05-05 ENCOUNTER — Other Ambulatory Visit: Payer: Self-pay | Admitting: Adult Health

## 2018-07-10 ENCOUNTER — Other Ambulatory Visit: Payer: Self-pay | Admitting: Adult Health

## 2018-08-08 ENCOUNTER — Other Ambulatory Visit: Payer: Self-pay | Admitting: *Deleted

## 2018-08-08 DIAGNOSIS — Z20822 Contact with and (suspected) exposure to covid-19: Secondary | ICD-10-CM

## 2018-08-10 LAB — NOVEL CORONAVIRUS, NAA: SARS-CoV-2, NAA: NOT DETECTED

## 2018-08-29 ENCOUNTER — Other Ambulatory Visit: Payer: Self-pay | Admitting: Adult Health

## 2018-09-01 ENCOUNTER — Other Ambulatory Visit: Payer: Self-pay | Admitting: Obstetrics and Gynecology

## 2018-10-27 ENCOUNTER — Encounter: Payer: Self-pay | Admitting: Advanced Practice Midwife

## 2018-10-27 ENCOUNTER — Other Ambulatory Visit (HOSPITAL_COMMUNITY)
Admission: RE | Admit: 2018-10-27 | Discharge: 2018-10-27 | Disposition: A | Discharge status: Home / Self Care | Payer: Medicaid Other | Source: Ambulatory Visit | Attending: Advanced Practice Midwife | Admitting: Advanced Practice Midwife

## 2018-10-27 ENCOUNTER — Other Ambulatory Visit: Payer: Self-pay

## 2018-10-27 ENCOUNTER — Ambulatory Visit (INDEPENDENT_AMBULATORY_CARE_PROVIDER_SITE_OTHER): Payer: Self-pay | Admitting: Advanced Practice Midwife

## 2018-10-27 VITALS — BP 139/85 | HR 82 | Ht 63.0 in | Wt 185.0 lb

## 2018-10-27 DIAGNOSIS — Z3009 Encounter for other general counseling and advice on contraception: Secondary | ICD-10-CM

## 2018-10-27 DIAGNOSIS — Z124 Encounter for screening for malignant neoplasm of cervix: Secondary | ICD-10-CM

## 2018-10-27 DIAGNOSIS — N898 Other specified noninflammatory disorders of vagina: Secondary | ICD-10-CM

## 2018-10-27 DIAGNOSIS — Z01419 Encounter for gynecological examination (general) (routine) without abnormal findings: Secondary | ICD-10-CM

## 2018-10-27 MED ORDER — FLUCONAZOLE 150 MG PO TABS: 150.0000 mg | ORAL_TABLET | Freq: Once | ORAL | 0 refills | Status: AC

## 2018-10-27 NOTE — Progress Notes (Signed)
[name] 38 y.o.  Vitals:   10/27/18 0925  BP: 139/85  Pulse: 82     Filed Weights   10/27/18 0925  Weight: 185 lb (83.9 kg)    Past Medical History: Past Medical History:  Diagnosis Date  . Abnormal pap 2009, 2010  . Dysmenorrhea 04/08/2015  . Fibromyalgia   . History of chlamydia   . History of frequent urinary tract infections   . History of gonorrhea   . History of trichomoniasis   . Hypertension   . Menorrhagia with irregular cycle 04/08/2015  . Migraines   . Pelvic pain in female 04/08/2015  . Rheumatoid arthritis(714.0)   . Smoker 04/08/2015  . Urinary frequency 04/08/2015  . Vaginal Pap smear, abnormal     Past Surgical History: Past Surgical History:  Procedure Laterality Date  . CESAREAN SECTION    . TUBAL LIGATION      Family History: Family History  Problem Relation Age of Onset  . Cancer Mother        cervical  . Cancer Maternal Aunt        breast   . Hypertension Maternal Aunt   . Cancer Maternal Grandmother        colon and uterine  . Cancer Maternal Aunt        colon  . Migraines Father   . Cancer Maternal Grandfather   . Cancer Paternal Grandmother   . Cancer Paternal Grandfather   . Gallstones Sister     Social History: Social History   Tobacco Use  . Smoking status: Current Every Day Smoker    Packs/day: 0.25    Years: 20.00    Pack years: 5.00    Types: Cigarettes  . Smokeless tobacco: Never Used  . Tobacco comment: smokes 5 cig daily  Substance Use Topics  . Alcohol use: Yes    Comment: OCC  . Drug use: No    Allergies: No Known Allergies    Current Outpatient Medications:  .  fluconazole (DIFLUCAN) 150 MG tablet, Take 1 tablet (150 mg total) by mouth once for 1 dose., Disp: 1 tablet, Rfl: 0  History of Present Illness: Here for pap and physical.  Last pap 2017, normal. Screening labs last year normal. Has CHTN, was rx'd lisinopril by PCP, quit taking (pt preference).  PCP in Dr. Charm Barges.  Saw [facility] last year  w/dysmenorrha, menorrhagia.  Pelvic US normal.  Had discussed ablation, IUD. Periods are still very painful, but doesn't have insurance right now.  Has [place] medicaid, but d/t BTL, is not eligible to bill it for any services. Wants to try COCs for dysmenorrhea. .    Review of Systems   Patient denies any headaches, blurred vision, shortness of breath, chest pain, abdominal pain, problems with bowel movements, urination, or intercourse. States has varying amounts of yellow vaginal discharge, "strong" but not fishy odor.  No sex in > year, had neg STD screening since last intercourse .   Physical Exam: General:  Well developed, well nourished, no acute distress Skin:  Warm and dry Neck:  Midline trachea, normal thyroid Lungs; Clear to auscultation bilaterally Breast:  No dominant palpable mass, retraction, or nipple discharge Cardiovascular: Regular rate and rhythm Abdomen:  Soft, non tender, no hepatosplenomegaly Pelvic:  External genitalia is normal in appearance.  The vagina is normal in appearance.  The cervix is bulbous.  Uterus is felt to be normal size, shape, and contour.  No adnexal masses or tenderness noted.  Extremities:  No swelling or varicosities noted Psych:  No mood changes.     Impression: Normal GYN exam Dysmenorrhea w/menorrhagia     Plan: If pap normal, repeat in 3 years.  Start LoLoestrin (samples given)  TRy to skip placebos to minimize # of periods a year.   Metrogel #1 dose sample given for some clue cells and Diflucan #1 rx for yeast on wet prep     call me when near end of 3rd pack of COCs to see how they are working

## 2018-10-27 NOTE — Patient Instructions (Signed)

## 2018-10-28 MED FILL — FLUARIX QUADRIVALENT 0.5 ML: 0.5 | 1 days supply | Qty: 1 | Fill #0

## 2018-10-31 LAB — CYTOLOGY - PAP
Diagnosis: NEGATIVE
High risk HPV: NEGATIVE

## 2018-12-31 ENCOUNTER — Other Ambulatory Visit: Payer: Self-pay | Admitting: Advanced Practice Midwife

## 2018-12-31 ENCOUNTER — Telehealth: Payer: Self-pay | Admitting: *Deleted

## 2018-12-31 MED ORDER — NORETHIN-ETH ESTRAD-FE BIPHAS 1 MG-10 MCG / 10 MCG PO TABS: 1.0000 | ORAL_TABLET | Freq: Every day | ORAL | 11 refills | Status: DC

## 2018-12-31 NOTE — Progress Notes (Signed)
loloestrin rx sent

## 2018-12-31 NOTE — Telephone Encounter (Signed)
Pt states the birth control that Manus Gunning put her on is working good and she needs more samples or a prescription. Pt is on her last week of pills. Thanks!!

## 2018-12-31 NOTE — Telephone Encounter (Signed)
Pt needs a prescription of Lo Loestrin sent to pharmacy. Thanks!! [place]

## 2019-01-01 ENCOUNTER — Other Ambulatory Visit: Payer: Self-pay | Admitting: Advanced Practice Midwife

## 2019-01-01 MED ORDER — NORETHIN-ETH ESTRAD-FE BIPHAS 1 MG-10 MCG / 10 MCG PO TABS: 1.0000 | ORAL_TABLET | Freq: Every day | ORAL | 11 refills | Status: DC

## 2019-01-01 NOTE — Telephone Encounter (Signed)
Manus Gunning sent in refill. [place]

## 2019-01-29 ENCOUNTER — Ambulatory Visit: Payer: Medicaid Other | Attending: Internal Medicine

## 2019-01-29 ENCOUNTER — Other Ambulatory Visit: Payer: Self-pay

## 2019-01-29 DIAGNOSIS — Z20822 Contact with and (suspected) exposure to covid-19: Secondary | ICD-10-CM

## 2019-01-30 LAB — NOVEL CORONAVIRUS, NAA: SARS-CoV-2, NAA: NOT DETECTED

## 2019-03-16 ENCOUNTER — Other Ambulatory Visit: Payer: Self-pay

## 2019-03-16 ENCOUNTER — Ambulatory Visit: Payer: Medicaid Other | Attending: Internal Medicine

## 2019-03-16 DIAGNOSIS — Z20822 Contact with and (suspected) exposure to covid-19: Secondary | ICD-10-CM

## 2019-03-17 ENCOUNTER — Telehealth: Payer: Self-pay

## 2019-03-17 LAB — NOVEL CORONAVIRUS, NAA: SARS-CoV-2, NAA: NOT DETECTED

## 2019-03-17 NOTE — Telephone Encounter (Signed)
Patient called for her COVID-19 test result done 03/16/19.  She was informed that her test was still active and had not resulted.  She verbalized understanding and will call back.

## 2019-03-31 ENCOUNTER — Other Ambulatory Visit: Payer: Medicaid Other

## 2019-04-01 ENCOUNTER — Ambulatory Visit: Payer: Medicaid Other | Attending: Internal Medicine

## 2019-04-01 ENCOUNTER — Other Ambulatory Visit: Payer: Self-pay

## 2019-04-01 DIAGNOSIS — Z20822 Contact with and (suspected) exposure to covid-19: Secondary | ICD-10-CM

## 2019-04-02 LAB — NOVEL CORONAVIRUS, NAA: SARS-CoV-2, NAA: NOT DETECTED

## 2019-11-16 ENCOUNTER — Other Ambulatory Visit: Payer: Self-pay | Admitting: Advanced Practice Midwife

## 2020-03-01 ENCOUNTER — Other Ambulatory Visit: Payer: Medicaid Other

## 2020-05-16 ENCOUNTER — Ambulatory Visit
Admission: EM | Admit: 2020-05-16 | Discharge: 2020-05-16 | Disposition: A | Discharge status: Home / Self Care | Payer: Medicaid Other | Attending: Family Medicine | Admitting: Family Medicine

## 2020-05-16 ENCOUNTER — Other Ambulatory Visit: Payer: Self-pay

## 2020-05-16 ENCOUNTER — Encounter: Payer: Self-pay | Admitting: Emergency Medicine

## 2020-05-16 DIAGNOSIS — J0110 Acute frontal sinusitis, unspecified: Secondary | ICD-10-CM

## 2020-05-16 MED ORDER — CETIRIZINE HCL 10 MG PO TABS: 10.0000 mg | ORAL_TABLET | Freq: Every day | ORAL | 0 refills | Status: DC

## 2020-05-16 MED ORDER — AMOXICILLIN 875 MG PO TABS: 875.0000 mg | ORAL_TABLET | Freq: Two times a day (BID) | ORAL | 0 refills | Status: AC

## 2020-05-16 MED ORDER — PREDNISONE 20 MG PO TABS: 20.0000 mg | ORAL_TABLET | Freq: Every day | ORAL | 0 refills | Status: AC

## 2020-05-16 NOTE — ED Triage Notes (Signed)
Pressure to LT side of face and nasal congestion x 4 days.  Also reports nausea.

## 2020-05-16 NOTE — ED Provider Notes (Signed)
[facility]    CSN: [id number] Arrival date & time: 05/16/20  1545      History   Chief Complaint No chief complaint on file.   HPI [name] is a 40 y.o. female.   HPI  Patient presents with URI symptoms including cough, left otalgia, nasal congestion, nausea and sinus pressure. Denies worrisome symptoms of shortness of breath, wheezing, or chest tightness. She attempted with OTC medication without relief.   Past Medical History:  Diagnosis Date  . Abnormal pap 2009, 2010  . Dysmenorrhea 04/08/2015  . Fibromyalgia   . History of chlamydia   . History of frequent urinary tract infections   . History of gonorrhea   . History of trichomoniasis   . Hypertension   . Menorrhagia with irregular cycle 04/08/2015  . Migraines   . Pelvic pain in female 04/08/2015  . Rheumatoid arthritis(714.0)   . Smoker 04/08/2015  . Urinary frequency 04/08/2015  . Vaginal Pap smear, abnormal     Patient Active Problem List   Diagnosis Date Noted  . Smoker 04/08/2015  . Menorrhagia with irregular cycle 04/08/2015  . Dysmenorrhea 04/08/2015  . H N P-LUMBAR 02/15/2009  . DISC DEGENERATION 12/05/2006  . BACK PAIN 10/21/2006  . IMPINGEMENT SYNDROME 10/21/2006    Past Surgical History:  Procedure Laterality Date  . CESAREAN SECTION    . TUBAL LIGATION      OB History    Gravida  2   Para  2   Term      Preterm      AB      Living  2     SAB      IAB      Ectopic      Multiple      Live Births               Home Medications    Prior to Admission medications   Medication Sig Start Date End Date Taking? Authorizing Provider  amoxicillin (AMOXIL) 875 MG tablet Take 1 tablet (875 mg total) by mouth 2 (two) times daily for 10 days. 05/16/20 05/26/20 Yes Bing Neighbors, FNP  cetirizine (ZYRTEC) 10 MG tablet Take 1 tablet (10 mg total) by mouth at bedtime. 05/16/20  Yes Bing Neighbors, FNP  predniSONE (DELTASONE) 20 MG tablet Take 1 tablet (20  mg total) by mouth daily with breakfast for 5 days. 05/16/20 05/21/20 Yes Bing Neighbors, FNP  LO LOESTRIN FE 1 MG-10 MCG / 10 MCG tablet TAKE 1 TABLET BY MOUTH DAILY 11/18/19   Jacklyn Shell, CNM    Family History Family History  Problem Relation Age of Onset  . Cancer Mother        cervical  . Cancer Maternal Aunt        breast   . Hypertension Maternal Aunt   . Cancer Maternal Grandmother        colon and uterine  . Cancer Maternal Aunt        colon  . Migraines Father   . Cancer Maternal Grandfather   . Cancer Paternal Grandmother   . Cancer Paternal Grandfather   . Gallstones Sister     Social History Social History   Tobacco Use  . Smoking status: Current Every Day Smoker    Packs/day: 0.25    Years: 20.00    Pack years: 5.00    Types: Cigarettes  . Smokeless tobacco: Never Used  . Tobacco comment: smokes 5 cig daily  Vaping Use  . Vaping Use: Never used  Substance Use Topics  . Alcohol use: Yes    Comment: OCC  . Drug use: No     Allergies   Patient has no known allergies.   Review of Systems Review of Systems Pertinent negatives listed in HPI  Physical Exam Triage Vital Signs ED Triage Vitals [05/16/20 1626]  Enc Vitals Group     BP      Pulse      Resp      Temp      Temp src      SpO2      Weight      Height      Head Circumference      Peak Flow      Pain Score 6     Pain Loc      Pain Edu?      Excl. in GC?    No data found.  Updated Vital Signs BP (!) 152/94 (BP Location: Right Arm)   Pulse 71   Temp 99 F (37.2 C) (Oral)   Resp 19   SpO2 98%   Visual Acuity Right Eye Distance:   Left Eye Distance:   Bilateral Distance:    Right Eye Near:   Left Eye Near:    Bilateral Near:     Physical Exam  General Appearance:    Alert, cooperative, no distress  HENT:   Normocephalic, ears normal, nares mucosal edema with congestion, rhinorrhea, oropharynx    Eyes:    PERRL, conjunctiva/corneas clear, EOM's  intact       Lungs:     Clear to auscultation bilaterally, respirations unlabored  Heart:    Regular rate and rhythm  Neurologic:   Awake, alert, oriented x 3. No apparent focal neurological           defect.       UC Treatments / Results  Labs (all labs ordered are listed, but only abnormal results are displayed) Labs Reviewed - No data to display  EKG   Radiology No results found.  Procedures Procedures (including critical care time)  Medications Ordered in UC Medications - No data to display  Initial Impression / Assessment and Plan / UC Course  I have reviewed the triage vital signs and the nursing notes.  Pertinent labs & imaging results that were available during my care of the patient were reviewed by me and considered in my medical decision making (see chart for details).     Acute sinusitis treatment with amoxicillin 875 twice daily for 10 days, for nasal congestion and rhinitis Zyrtec 10 mg daily at bedtime. Facial pressure unilateral mucosa edema, prednisone 20 mg daily x 5 days.PCP follow-up as needed  Final Clinical Impressions(s) / UC Diagnoses   Final diagnoses:  Acute non-recurrent frontal sinusitis   Discharge Instructions   None    ED Prescriptions    Medication Sig Dispense Auth. Provider   amoxicillin (AMOXIL) 875 MG tablet Take 1 tablet (875 mg total) by mouth 2 (two) times daily for 10 days. 20 tablet Bing Neighbors, FNP   cetirizine (ZYRTEC) 10 MG tablet Take 1 tablet (10 mg total) by mouth at bedtime. 30 tablet Bing Neighbors, FNP   predniSONE (DELTASONE) 20 MG tablet Take 1 tablet (20 mg total) by mouth daily with breakfast for 5 days. 5 tablet Bing Neighbors, FNP     PDMP not reviewed this encounter.   Bing Neighbors, [place] 05/19/20 [phone]

## 2020-05-30 ENCOUNTER — Ambulatory Visit: Payer: Medicaid Other | Admitting: Adult Health

## 2020-06-03 ENCOUNTER — Encounter: Payer: Self-pay | Admitting: Adult Health

## 2020-06-03 ENCOUNTER — Other Ambulatory Visit (HOSPITAL_COMMUNITY)
Admission: RE | Admit: 2020-06-03 | Discharge: 2020-06-03 | Disposition: A | Discharge status: Home / Self Care | Payer: 59 | Source: Ambulatory Visit | Attending: Adult Health | Admitting: Adult Health

## 2020-06-03 ENCOUNTER — Ambulatory Visit (INDEPENDENT_AMBULATORY_CARE_PROVIDER_SITE_OTHER): Payer: 59 | Admitting: Adult Health

## 2020-06-03 ENCOUNTER — Other Ambulatory Visit: Payer: Self-pay

## 2020-06-03 VITALS — BP 164/96 | HR 76 | Ht 63.0 in | Wt 192.6 lb

## 2020-06-03 DIAGNOSIS — Z8742 Personal history of other diseases of the female genital tract: Secondary | ICD-10-CM | POA: Diagnosis not present

## 2020-06-03 DIAGNOSIS — N760 Acute vaginitis: Secondary | ICD-10-CM | POA: Diagnosis not present

## 2020-06-03 DIAGNOSIS — N921 Excessive and frequent menstruation with irregular cycle: Secondary | ICD-10-CM

## 2020-06-03 DIAGNOSIS — B9689 Other specified bacterial agents as the cause of diseases classified elsewhere: Secondary | ICD-10-CM | POA: Insufficient documentation

## 2020-06-03 DIAGNOSIS — I10 Essential (primary) hypertension: Secondary | ICD-10-CM | POA: Diagnosis not present

## 2020-06-03 DIAGNOSIS — F17200 Nicotine dependence, unspecified, uncomplicated: Secondary | ICD-10-CM

## 2020-06-03 DIAGNOSIS — Z113 Encounter for screening for infections with a predominantly sexual mode of transmission: Secondary | ICD-10-CM | POA: Insufficient documentation

## 2020-06-03 DIAGNOSIS — N898 Other specified noninflammatory disorders of vagina: Secondary | ICD-10-CM | POA: Insufficient documentation

## 2020-06-03 DIAGNOSIS — R69 Illness, unspecified: Secondary | ICD-10-CM | POA: Diagnosis not present

## 2020-06-03 MED ORDER — TRIAMTERENE-HCTZ 37.5-25 MG PO TABS: 1.0000 | ORAL_TABLET | Freq: Every day | ORAL | 1 refills | Status: DC

## 2020-06-03 NOTE — Progress Notes (Signed)
  Subjective:     Patient ID: [name], female   DOB: 02/09/1980, 40 y.o.   MRN: [id number]  HPI Thomasine is a 40 year old black female, single, G2P2 in complaining of spotting and bleeding after sex. Has had headaches lately, and was seen a Urgent Care 05/16/20. She is on lo Loestrin for dysmenorrhea, and it works. She is smoking.  Lab Results  Component Value Date   DIAGPAP  10/27/2018    - Negative for intraepithelial lesion or malignancy (NILM)   HPVHIGH Negative 10/27/2018   No PCP.  Review of Systems Spotting bleed, may bleeding after sex when wipes Has had headache, with some blurred vision left eye, recently treated at Urgent Care for ?sinus infection,did not take steroids, make her feel worse Painful periods  Reviewed past medical,surgical, social and family history. Reviewed medications and allergies.     Objective:   Physical Exam BP (!) 164/96 (BP Location: Left Arm, Cuff Size: Large)   Pulse 76   Ht 5\' 3"  (1.6 m)   Wt 192 lb 9.6 oz (87.4 kg)   LMP 05/16/2020 (Approximate)   BMI 34.12 kg/m    Skin warm and dry.  Lungs: clear to ausculation bilaterally. Cardiovascular: regular rate and rhythm. Pelvic: external genitalia is normal in appearance no lesions, vagina: tanish discharge with odor,urethra has no lesions or masses noted, cervix:smooth and bulbous, uterus: normal size, shape and contour, non tender, no masses felt, adnexa: no masses or tenderness noted. Bladder is non tender and no masses felt. CV swab obtained. Examination chaperoned by 05/18/2020.    Upstream - 06/03/20 1024      Pregnancy Intention Screening   Does the patient want to become pregnant in the next year? No    Does the patient's partner want to become pregnant in the next year? No    Would the patient like to discuss contraceptive options today? No      Contraception Wrap Up   Current Method Female Sterilization    End Method Female Sterilization    Contraception Counseling Provided No           Assessment:      1. Hypertension, unspecified type Will start Maxzide today And check labs today  Meds ordered this encounter  Medications  . triamterene-hydrochlorothiazide (MAXZIDE-25) 37.5-25 MG tablet    Sig: Take 1 tablet by mouth daily.    Dispense:  30 tablet    Refill:  1    Order Specific Question:   Supervising Provider    Answer:   10-29-1991 H [[id number]]   - Comprehensive metabolic panel - TSH Review DASH diet To start decreasing salt and sugar  Follow up in 1 week to recheck BP Has appt with [facility] 06/30/20  2. Irregular intermenstrual bleeding CV swab sent  - CBC - Cervicovaginal ancillary only  3. Vaginal discharge CV swab sent for GC/CHL,trich,BV and yeast  - Cervicovaginal ancillary only  4. History of dysmenorrhea Stop lo Loestrin now  5. Smoker Start to decrease smoking     Plan:     Stop Lo Loestrin today Start Maxzide today Start to decrease smoking Decrease salt and sugar  Follow up in 1 week

## 2020-06-03 NOTE — Patient Instructions (Signed)

## 2020-06-04 LAB — COMPREHENSIVE METABOLIC PANEL
ALT: 24 IU/L (ref 0–32)
AST: 14 IU/L (ref 0–40)
Albumin/Globulin Ratio: 1.7 (ref 1.2–2.2)
Albumin: 4.2 g/dL (ref 3.8–4.8)
Alkaline Phosphatase: 68 IU/L (ref 44–121)
BUN/Creatinine Ratio: 9 (ref 9–23)
BUN: 7 mg/dL (ref 6–24)
Bilirubin Total: 0.6 mg/dL (ref 0.0–1.2)
CO2: 22 mmol/L (ref 20–29)
Calcium: 8.6 mg/dL — ABNORMAL LOW (ref 8.7–10.2)
Chloride: 105 mmol/L (ref 96–106)
Creatinine, Ser: 0.82 mg/dL (ref 0.57–1.00)
Globulin, Total: 2.5 g/dL (ref 1.5–4.5)
Glucose: 84 mg/dL (ref 65–99)
Potassium: 4.2 mmol/L (ref 3.5–5.2)
Sodium: 139 mmol/L (ref 134–144)
Total Protein: 6.7 g/dL (ref 6.0–8.5)
eGFR: 92 mL/min/{1.73_m2} (ref >59)

## 2020-06-04 LAB — TSH: TSH: 1.3 u[IU]/mL (ref 0.450–4.500)

## 2020-06-04 LAB — CBC
Hematocrit: 35.9 % (ref 34.0–46.6)
Hemoglobin: 12.2 g/dL (ref 11.1–15.9)
MCH: 32.4 pg (ref 26.6–33.0)
MCHC: 34 g/dL (ref 31.5–35.7)
MCV: 95 fL (ref 79–97)
Platelets: 217 x10E3/uL (ref 150–450)
RBC: 3.77 x10E6/uL (ref 3.77–5.28)
RDW: 11.7 % (ref 11.7–15.4)
WBC: 4.8 x10E3/uL (ref 3.4–10.8)

## 2020-06-06 ENCOUNTER — Other Ambulatory Visit: Payer: Self-pay | Admitting: Adult Health

## 2020-06-06 ENCOUNTER — Ambulatory Visit: Payer: Medicaid Other | Admitting: Adult Health

## 2020-06-06 LAB — CERVICOVAGINAL ANCILLARY ONLY
Bacterial Vaginitis (gardnerella): POSITIVE — AB
Candida Glabrata: NEGATIVE
Candida Vaginitis: NEGATIVE
Chlamydia: NEGATIVE
Comment: NEGATIVE
Comment: NEGATIVE
Comment: NEGATIVE
Comment: NEGATIVE
Comment: NEGATIVE
Comment: NORMAL
Neisseria Gonorrhea: NEGATIVE
Trichomonas: NEGATIVE

## 2020-06-06 MED ORDER — METRONIDAZOLE 500 MG PO TABS: 500.0000 mg | ORAL_TABLET | Freq: Two times a day (BID) | ORAL | 0 refills | Status: DC

## 2020-06-06 NOTE — Progress Notes (Signed)
+  BV on vaginal swab, will rx flagyl 

## 2020-06-10 ENCOUNTER — Ambulatory Visit: Payer: 59 | Admitting: Adult Health

## 2020-08-31 ENCOUNTER — Other Ambulatory Visit: Payer: Self-pay | Admitting: Adult Health

## 2020-10-05 ENCOUNTER — Other Ambulatory Visit: Payer: Self-pay | Admitting: Adult Health

## 2020-12-10 ENCOUNTER — Other Ambulatory Visit: Payer: Self-pay | Admitting: Adult Health

## 2021-05-24 ENCOUNTER — Other Ambulatory Visit: Payer: 59 | Admitting: Adult Health

## 2021-06-20 ENCOUNTER — Other Ambulatory Visit (HOSPITAL_COMMUNITY)
Admission: RE | Admit: 2021-06-20 | Discharge: 2021-06-20 | Disposition: A | Discharge status: Home / Self Care | Payer: 59 | Source: Ambulatory Visit | Attending: Adult Health | Admitting: Adult Health

## 2021-06-20 ENCOUNTER — Ambulatory Visit (INDEPENDENT_AMBULATORY_CARE_PROVIDER_SITE_OTHER): Payer: 59 | Admitting: Adult Health

## 2021-06-20 ENCOUNTER — Encounter: Payer: Self-pay | Admitting: Adult Health

## 2021-06-20 VITALS — BP 105/76 | HR 71 | Ht 63.0 in | Wt 196.0 lb

## 2021-06-20 DIAGNOSIS — Z01419 Encounter for gynecological examination (general) (routine) without abnormal findings: Secondary | ICD-10-CM | POA: Diagnosis present

## 2021-06-20 DIAGNOSIS — R058 Other specified cough: Secondary | ICD-10-CM | POA: Diagnosis not present

## 2021-06-20 DIAGNOSIS — Z01411 Encounter for gynecological examination (general) (routine) with abnormal findings: Secondary | ICD-10-CM

## 2021-06-20 DIAGNOSIS — Z1211 Encounter for screening for malignant neoplasm of colon: Secondary | ICD-10-CM | POA: Diagnosis not present

## 2021-06-20 DIAGNOSIS — R0609 Other forms of dyspnea: Secondary | ICD-10-CM | POA: Diagnosis not present

## 2021-06-20 DIAGNOSIS — F419 Anxiety disorder, unspecified: Secondary | ICD-10-CM

## 2021-06-20 DIAGNOSIS — F32A Depression, unspecified: Secondary | ICD-10-CM

## 2021-06-20 DIAGNOSIS — N951 Menopausal and female climacteric states: Secondary | ICD-10-CM

## 2021-06-20 DIAGNOSIS — U099 Post covid-19 condition, unspecified: Secondary | ICD-10-CM

## 2021-06-20 DIAGNOSIS — R062 Wheezing: Secondary | ICD-10-CM

## 2021-06-20 DIAGNOSIS — R059 Cough, unspecified: Secondary | ICD-10-CM | POA: Insufficient documentation

## 2021-06-20 DIAGNOSIS — F17200 Nicotine dependence, unspecified, uncomplicated: Secondary | ICD-10-CM

## 2021-06-20 DIAGNOSIS — N921 Excessive and frequent menstruation with irregular cycle: Secondary | ICD-10-CM

## 2021-06-20 LAB — HEMOCCULT GUIAC POC 1CARD (OFFICE): Fecal Occult Blood, POC: NEGATIVE

## 2021-06-20 MED ORDER — SERTRALINE HCL 50 MG PO TABS: 50.0000 mg | ORAL_TABLET | Freq: Every day | ORAL | 3 refills | Status: AC

## 2021-06-20 NOTE — Progress Notes (Signed)
Patient ID: [name], female   DOB: 1980/04/19, 41 y.o.   MRN: [id number] History of Present Illness: [name] is a 41 year old black female, with SO, G2P2 in for a well woman gyn exam and pap. She is having cough and shortness of breath post COVID 3 weeks ago. And periods are irregular, has skipped 2 and then may bleed heavy with clots and cramps. 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   PCP is A Artist PA in [place].  Current Medications, Allergies, Past Medical History, Past Surgical History, Family History and Social History were reviewed in [employer] record.     Review of Systems: Patient denies any headaches, hearing loss, fatigue, blurred vision, chest pain, abdominal pain, problems with bowel movements, urination, or intercourse.(Has pain at times). No joint pain or mood swings.  See HPI for positives.    Physical Exam:BP 105/76 (BP Location: Right Arm, Patient Position: Sitting, Cuff Size: Normal)   Pulse 71   Ht 5\' 3"  (1.6 m)   Wt 196 lb (88.9 kg)   LMP 06/05/2021   BMI 34.72 kg/m   General:  Well developed, well nourished, no acute distress Skin:  Warm and dry Neck:  Midline trachea, normal thyroid, good ROM, no lymphadenopathy Lungs: +wheezing on expiration both lungs L>R Breast:  No dominant palpable mass, retraction, or nipple discharge Cardiovascular: Regular rate and rhythm Abdomen:  Soft, non tender, no hepatosplenomegaly Pelvic:  External genitalia is normal in appearance, no lesions.  The vagina is normal in appearance. Urethra has no lesions or masses. The cervix is bulbous. Pap with GC/CHL and HR HPV genotyping performed. Uterus is felt to be normal size, shape, and contour.  No adnexal masses or tenderness noted.Bladder is non tender, no masses felt. Rectal: Good sphincter tone, no polyps, or hemorrhoids felt.  Hemoccult negative. Extremities/musculoskeletal:  No swelling or varicosities noted, no clubbing or cyanosis Psych:  No  mood changes, alert and cooperative,seems happy AA is 1 Fall risk is low    06/20/2021    8:45 AM 10/27/2018    9:27 AM 03/08/2017   10:06 AM  Depression screen PHQ 2/9  Decreased Interest 3 0 0  Down, Depressed, Hopeless 2 0 0  PHQ - 2 Score 5 0 0  Altered sleeping 3    Tired, decreased energy 3    Change in appetite 2    Feeling bad or failure about yourself  2    Trouble concentrating 3    Moving slowly or fidgety/restless 1    Suicidal thoughts 0    PHQ-9 Score 19         06/20/2021    8:45 AM  GAD 7 : Generalized Anxiety Score  Nervous, Anxious, on Edge 3  Control/stop worrying 3  Worry too much - different things 3  Trouble relaxing 3  Restless 2  Easily annoyed or irritable 2  Afraid - awful might happen 2  Total GAD 7 Score 18       Upstream - 06/20/21 0846       Pregnancy Intention Screening   Does the patient want to become pregnant in the next year? No    Does the patient's partner want to become pregnant in the next year? No    Would the patient like to discuss contraceptive options today? No      Contraception Wrap Up   Current Method Female Sterilization    End Method Female Sterilization    Contraception  Counseling Provided No           Examination chaperoned by Faith Rogue LPN  Impression and Plan: 1. Encounter for gynecological examination with Papanicolaou smear of cervix Pap sent Physical in 1 year Pap in 3 if normal  2. Encounter for screening fecal occult blood testing Hemoccult negative   3. Menorrhagia with irregular cycle Will follow for now, probably perimenopause   4. Smoker Try to start cutting down to quit   5. Other cough Will get chest xray to rule out pneumonia post Covid   6. Post-COVID chronic dyspnea Will get chest xray, orders in  7. Wheezing Will get chest xray  8. Anxiety and depression She is open to meds  Will try zoloft 50 mg daily Meds ordered this encounter  Medications   sertraline (ZOLOFT) 50  MG tablet    Sig: Take 1 tablet (50 mg total) by mouth daily.    Dispense:  30 tablet    Refill:  3    Order Specific Question:   Supervising Provider    Answer:   Duane Lope H [[id number]]    Follow up in 8 weeks for ROS   9. Peri-menopause Review handout

## 2021-06-22 ENCOUNTER — Ambulatory Visit (HOSPITAL_COMMUNITY)
Admission: RE | Admit: 2021-06-22 | Discharge: 2021-06-22 | Disposition: A | Discharge status: Home / Self Care | Payer: 59 | Source: Ambulatory Visit | Attending: Adult Health | Admitting: Adult Health

## 2021-06-22 DIAGNOSIS — U099 Post covid-19 condition, unspecified: Secondary | ICD-10-CM | POA: Diagnosis present

## 2021-06-22 DIAGNOSIS — R0609 Other forms of dyspnea: Secondary | ICD-10-CM | POA: Diagnosis present

## 2021-06-22 DIAGNOSIS — R058 Other specified cough: Secondary | ICD-10-CM | POA: Insufficient documentation

## 2021-06-22 DIAGNOSIS — R062 Wheezing: Secondary | ICD-10-CM | POA: Diagnosis present

## 2021-06-22 LAB — CYTOLOGY - PAP
Chlamydia: NEGATIVE
Comment: NEGATIVE
Comment: NEGATIVE
Comment: NORMAL
Diagnosis: NEGATIVE
High risk HPV: NEGATIVE
Neisseria Gonorrhea: NEGATIVE

## 2021-08-15 ENCOUNTER — Ambulatory Visit: Payer: 59 | Admitting: Adult Health

## 2021-12-19 ENCOUNTER — Other Ambulatory Visit (HOSPITAL_COMMUNITY): Payer: Self-pay | Admitting: Family Medicine

## 2021-12-19 DIAGNOSIS — N6310 Unspecified lump in the right breast, unspecified quadrant: Secondary | ICD-10-CM

## 2022-01-04 ENCOUNTER — Ambulatory Visit (HOSPITAL_COMMUNITY)
Admission: RE | Admit: 2022-01-04 | Discharge: 2022-01-04 | Disposition: A | Discharge status: Home / Self Care | Payer: 59 | Source: Ambulatory Visit | Attending: Family Medicine | Admitting: Family Medicine

## 2022-01-04 DIAGNOSIS — N6310 Unspecified lump in the right breast, unspecified quadrant: Secondary | ICD-10-CM

## 2022-08-13 ENCOUNTER — Other Ambulatory Visit: Payer: Self-pay

## 2022-08-13 ENCOUNTER — Encounter: Payer: Self-pay | Admitting: Internal Medicine

## 2022-08-13 ENCOUNTER — Ambulatory Visit (INDEPENDENT_AMBULATORY_CARE_PROVIDER_SITE_OTHER): Payer: 59 | Admitting: Internal Medicine

## 2022-08-13 VITALS — BP 122/84 | HR 91 | Temp 98.0°F | Resp 18 | Ht 63.43 in | Wt 204.8 lb

## 2022-08-13 DIAGNOSIS — J3089 Other allergic rhinitis: Secondary | ICD-10-CM

## 2022-08-13 DIAGNOSIS — F17218 Nicotine dependence, cigarettes, with other nicotine-induced disorders: Secondary | ICD-10-CM

## 2022-08-13 DIAGNOSIS — Z79899 Other long term (current) drug therapy: Secondary | ICD-10-CM

## 2022-08-13 DIAGNOSIS — G43811 Other migraine, intractable, with status migrainosus: Secondary | ICD-10-CM

## 2022-08-13 DIAGNOSIS — H1013 Acute atopic conjunctivitis, bilateral: Secondary | ICD-10-CM

## 2022-08-13 MED ORDER — FLUTICASONE PROPIONATE 50 MCG/ACT NA SUSP: 2.0000 | Freq: Every day | NASAL | 5 refills | Status: AC

## 2022-08-13 NOTE — Progress Notes (Signed)
NEW PATIENT  Date of Service/Encounter:  08/13/22  Consult requested by: Pcp, No   Subjective:   [name] (DOB: 05/05/1980) is a 42 y.o. female who presents to the clinic on 08/13/2022 with a chief complaint of Sinus Problem (Dealing with sinus headaches. MRI in October showed sinus cavity issues. ) and Allergic Rhinitis  (Season allergies. Not too many issues as she did when she was a child but she has some runny nose, itchy and watery eyes. ) .    History obtained from: chart review and patient.  Rhinitis Headaches  Started in childhood.  It was worse in childhood but now she has less eye symptoms and more headaches.   Symptoms include: nasal congestion, rhinorrhea, sneezing, watery eyes, and itchy eyes, post nasal drip,  Started about 4-5 years ago.  Also having severe headaches lasting for days to weeks, throbbing pain, occurring almost every weekly. Tried Propranolol with Neurology with minimal relief.  Does not always have sinus/nasal symptoms with the headaches.  Mostly occurring on L forehead and around the eyes.    Occurs seasonally-Spring/Summer  Potential triggers: pollen  Treatments tried:  Zyrtec/Claritin PRN; did not help. Last use was a long time ago Previously on Flonase  Previous allergy testing: yes but can't recall results. History of reflux/heartburn: none History of sinus surgery: no Nonallergic triggers: none    Tobacco Use:  Smoking 5 cigs/year Over 20 years 1/2ppd is the highest  Working on quitting using nicotine patches.   Past Medical History: Past Medical History:  Diagnosis Date   Abnormal pap 2009, 2010   Dysmenorrhea 04/08/2015   Fibromyalgia    History of chlamydia    History of frequent urinary tract infections    History of gonorrhea    History of trichomoniasis    Hypertension    Menorrhagia with irregular cycle 04/08/2015   Migraines    Pelvic pain in female 04/08/2015   Rheumatoid arthritis(714.0)    Smoker 04/08/2015    Urinary frequency 04/08/2015   Vaginal Pap smear, abnormal     Past Surgical History: Past Surgical History:  Procedure Laterality Date   CESAREAN SECTION  2006   TUBAL LIGATION  2006    Family History: Family History  Problem Relation Age of Onset   Cancer Mother        cervical   Allergic rhinitis Father    Migraines Father    Gallstones Sister    Cancer Maternal Aunt        breast    Hypertension Maternal Aunt    Cancer Maternal Aunt        colon   Allergic rhinitis Maternal Grandmother    Cancer Maternal Grandmother        colon and uterine   Cancer Maternal Grandfather    Cancer Paternal Grandmother    Cancer Paternal Grandfather     Social History:  Flooring in bedroom: Engineer, civil (consulting) Pets: none Tobacco use/exposure: 1997-current, <1/2ppd Job: Social worker  Medication List:  Allergies as of 08/13/2022   No Known Allergies      Medication List        Accurate as of August 13, 2022  2:16 PM. If you have any questions, ask your nurse or doctor.          STOP taking these medications    atenolol-chlorthalidone 50-25 MG tablet Commonly known as: TENORETIC Stopped by: Birder Robson   ibuprofen 200 MG tablet Commonly known as: ADVIL Stopped by: Birder Robson  TAKE these medications    losartan 100 MG tablet Commonly known as: COZAAR Take 100 mg by mouth daily.   Nicotine 21-14-7 MG/24HR Kit Apply 1 kit topically daily.   propranolol 10 MG tablet Commonly known as: INDERAL Take 10 mg by mouth 3 (three) times daily.   sertraline 50 MG tablet Commonly known as: Zoloft Take 1 tablet (50 mg total) by mouth daily.         REVIEW OF SYSTEMS: Pertinent positives and negatives discussed in HPI.   Objective:   Physical Exam: BP 122/84   Pulse 91   Temp 98 F (36.7 C) (Temporal)   Resp 18   Ht 5' 3.43" (1.611 m)   Wt 204 lb 12.8 oz (92.9 kg)   SpO2 99%   BMI 35.79 kg/m  Body mass index is 35.79 kg/m. GEN: alert, well  developed HEENT: clear conjunctiva, TM grey and translucent, nose with + inferior turbinate hypertrophy, pink nasal mucosa, slight clear rhinorrhea, no cobblestoning HEART: regular rate and rhythm, no murmur LUNGS: clear to auscultation bilaterally, no coughing, unlabored respiration ABDOMEN: soft, non distended  SKIN: no rashes or lesions  Reviewed:  10/26/2022: seen in ED for headaches with stroke like symptoms (facial paresthesia, chest wall pain and L arm weakness). Follow up with Neurology.  BP elevated, started on Losartan.   10/26/2022: MRI with nonspecific T2 FLAIR hyperintense abnormality of bl frontal lobe white matter.    06/20/2021: seen by obgyn for well visit and screening.  Also noted to have cough with wheezing, current smoker. Plan for CXR. CXR with subtle opacity of L lung base   05/16/2020: seen in urgent care for cough, L otalgia, nasal congestion, nausea, sinus pressure. Started on Amoxicillin, Zyrtec and oral prednisone for acute sinusitis.    Assessment:   No diagnosis found.  Plan/Recommendations:  Other Allergic Rhinitis: - Due to turbinate hypertrophy, seasonal symptoms, severe headches and unresponsive to over the counter meds, performed skin testing to identify aeroallergen triggers.   - Use nasal saline rinses before nose sprays such as with Neilmed Sinus Rinse.  Use distilled water.   - Use Flonase 2 sprays each nostril daily. Aim upward and outward. - Hold all anti histamines 3 days prior to next visit. Will plan to do testing with aeroallergens 1-55 at next visit, unable to test today due to [facility] insurance.   Tobacco Use Patient provided counseling on tobacco cessation today. Discussed the various impacts smoking has on health such as increased infections, lung disease, malignancy, heart disease, stroke etc. Patient is alert and competent.   Willingness to quit: working on it Methods/skills for cessation: nicotine patches Medication management for smoking:  nicotine patches from PCP Resources provided include discussion of contribution to worsening headaches  Quit date:ASAP  Will discuss further at next follow up visit. Spent over 3 minutes for tobacco cessation counseling.     - Work on tobacco cessation as that can cause migraines to worsen.  Migraines - Follow up with Neurology. - We will evaluate for environmental causes through skin testing at next visit.  - Continue propranolol as prescribed for migraine prevention.      Return in about 1 week (around 08/20/2022).  Alesia Morin, MD [facility]

## 2022-08-13 NOTE — Patient Instructions (Addendum)
Other Allergic Rhinitis: - Use nasal saline rinses before nose sprays such as with Neilmed Sinus Rinse.  Use distilled water.   - Use Flonase 2 sprays each nostril daily. Aim upward and outward. - Hold all anti histamines 3 days prior to next visit. Will plan to do testing at next visit, unable to test today due to [facility] insurance.   Tobacco Use - Work on tobacco cessation as that can cause migraines to worsen.  Migraines - Follow up with Neurology after our next visit. - We will evaluate for environmental causes through skin testing at next visit.     Follow up on 08/20/2022: 1:30 PM overbook for skin testing.

## 2022-08-20 ENCOUNTER — Ambulatory Visit: Payer: 59 | Admitting: Internal Medicine

## 2022-08-23 ENCOUNTER — Other Ambulatory Visit (HOSPITAL_COMMUNITY): Payer: Self-pay | Admitting: Neurological Surgery

## 2022-08-23 DIAGNOSIS — M5116 Intervertebral disc disorders with radiculopathy, lumbar region: Secondary | ICD-10-CM

## 2022-08-25 ENCOUNTER — Encounter (HOSPITAL_COMMUNITY): Payer: Self-pay

## 2022-08-25 ENCOUNTER — Ambulatory Visit (HOSPITAL_COMMUNITY)
Admission: RE | Admit: 2022-08-25 | Discharge: 2022-08-25 | Disposition: A | Discharge status: Home / Self Care | Payer: 59 | Source: Ambulatory Visit | Attending: Neurological Surgery | Admitting: Neurological Surgery

## 2022-08-25 DIAGNOSIS — M5116 Intervertebral disc disorders with radiculopathy, lumbar region: Secondary | ICD-10-CM | POA: Diagnosis present

## 2023-05-01 ENCOUNTER — Ambulatory Visit: Payer: Self-pay

## 2023-05-14 ENCOUNTER — Other Ambulatory Visit (HOSPITAL_COMMUNITY): Payer: Self-pay | Admitting: Family Medicine

## 2023-05-14 DIAGNOSIS — Z1231 Encounter for screening mammogram for malignant neoplasm of breast: Secondary | ICD-10-CM

## 2023-07-01 ENCOUNTER — Inpatient Hospital Stay (HOSPITAL_COMMUNITY): Admission: RE | Admit: 2023-07-01 | Payer: Self-pay | Source: Ambulatory Visit

## 2023-11-18 ENCOUNTER — Ambulatory Visit (HOSPITAL_COMMUNITY): Payer: Self-pay

## 2024-01-29 ENCOUNTER — Other Ambulatory Visit (HOSPITAL_COMMUNITY): Payer: Self-pay | Admitting: Family Medicine

## 2024-01-29 DIAGNOSIS — Z1231 Encounter for screening mammogram for malignant neoplasm of breast: Secondary | ICD-10-CM

## 2024-02-03 ENCOUNTER — Inpatient Hospital Stay (HOSPITAL_COMMUNITY): Admission: RE | Admit: 2024-02-03 | Payer: Self-pay | Source: Ambulatory Visit
